# Patient Record
Sex: Female | Born: 1960 | Race: White | Hispanic: No | Marital: Single | Smoking: Former smoker
Health system: Southern US, Community
[De-identification: ages and names within clinical notes are randomized; demographics above are authoritative.]

## PROBLEM LIST (undated history)

## (undated) DIAGNOSIS — I739 Peripheral vascular disease, unspecified: Secondary | ICD-10-CM

## (undated) DIAGNOSIS — E785 Hyperlipidemia, unspecified: Secondary | ICD-10-CM

## (undated) DIAGNOSIS — D649 Anemia, unspecified: Secondary | ICD-10-CM

## (undated) DIAGNOSIS — T8859XA Other complications of anesthesia, initial encounter: Secondary | ICD-10-CM

## (undated) DIAGNOSIS — R519 Headache, unspecified: Secondary | ICD-10-CM

## (undated) DIAGNOSIS — I10 Essential (primary) hypertension: Secondary | ICD-10-CM

## (undated) DIAGNOSIS — A630 Anogenital (venereal) warts: Secondary | ICD-10-CM

## (undated) DIAGNOSIS — T7840XA Allergy, unspecified, initial encounter: Secondary | ICD-10-CM

## (undated) DIAGNOSIS — F419 Anxiety disorder, unspecified: Secondary | ICD-10-CM

## (undated) DIAGNOSIS — F32A Depression, unspecified: Secondary | ICD-10-CM

## (undated) DIAGNOSIS — F329 Major depressive disorder, single episode, unspecified: Secondary | ICD-10-CM

## (undated) HISTORY — PX: OTHER SURGICAL HISTORY: SHX169

## (undated) HISTORY — DX: Major depressive disorder, single episode, unspecified: F32.9

## (undated) HISTORY — DX: Hyperlipidemia, unspecified: E78.5

## (undated) HISTORY — DX: Allergy, unspecified, initial encounter: T78.40XA

## (undated) HISTORY — DX: Anogenital (venereal) warts: A63.0

## (undated) HISTORY — DX: Depression, unspecified: F32.A

---

## 1991-01-08 DIAGNOSIS — A630 Anogenital (venereal) warts: Secondary | ICD-10-CM

## 1991-01-08 HISTORY — DX: Anogenital (venereal) warts: A63.0

## 1995-01-08 HISTORY — PX: PELVIC LAPAROSCOPY: SHX162

## 2000-05-15 ENCOUNTER — Ambulatory Visit (HOSPITAL_COMMUNITY): Admission: RE | Admit: 2000-05-15 | Discharge: 2000-05-15 | Payer: Self-pay | Admitting: Internal Medicine

## 2000-05-15 ENCOUNTER — Encounter: Payer: Self-pay | Admitting: Internal Medicine

## 2000-09-30 ENCOUNTER — Encounter: Payer: Self-pay | Admitting: Family Medicine

## 2000-09-30 ENCOUNTER — Ambulatory Visit (HOSPITAL_COMMUNITY): Admission: RE | Admit: 2000-09-30 | Discharge: 2000-09-30 | Payer: Self-pay | Admitting: Family Medicine

## 2002-02-15 ENCOUNTER — Encounter (HOSPITAL_COMMUNITY): Admission: RE | Admit: 2002-02-15 | Discharge: 2002-03-17 | Payer: Self-pay | Admitting: Oncology

## 2002-02-15 ENCOUNTER — Encounter: Payer: Self-pay | Admitting: Podiatry

## 2002-08-19 ENCOUNTER — Other Ambulatory Visit: Admission: RE | Admit: 2002-08-19 | Discharge: 2002-08-19 | Payer: Self-pay | Admitting: Unknown Physician Specialty

## 2003-03-25 ENCOUNTER — Ambulatory Visit (HOSPITAL_COMMUNITY): Admission: RE | Admit: 2003-03-25 | Discharge: 2003-03-25 | Payer: Self-pay | Admitting: Obstetrics and Gynecology

## 2004-02-07 ENCOUNTER — Ambulatory Visit (HOSPITAL_COMMUNITY): Admission: RE | Admit: 2004-02-07 | Discharge: 2004-02-07 | Payer: Self-pay | Admitting: Family Medicine

## 2004-04-04 ENCOUNTER — Ambulatory Visit (HOSPITAL_COMMUNITY): Admission: RE | Admit: 2004-04-04 | Discharge: 2004-04-04 | Payer: Self-pay | Admitting: Obstetrics and Gynecology

## 2004-06-19 ENCOUNTER — Ambulatory Visit (HOSPITAL_COMMUNITY): Admission: RE | Admit: 2004-06-19 | Discharge: 2004-06-19 | Payer: Self-pay | Admitting: Family Medicine

## 2005-01-01 ENCOUNTER — Ambulatory Visit (HOSPITAL_COMMUNITY): Admission: RE | Admit: 2005-01-01 | Discharge: 2005-01-01 | Payer: Self-pay | Admitting: Family Medicine

## 2005-04-05 ENCOUNTER — Ambulatory Visit (HOSPITAL_COMMUNITY): Admission: RE | Admit: 2005-04-05 | Discharge: 2005-04-05 | Payer: Self-pay | Admitting: Obstetrics and Gynecology

## 2006-04-08 ENCOUNTER — Ambulatory Visit (HOSPITAL_COMMUNITY): Admission: RE | Admit: 2006-04-08 | Discharge: 2006-04-08 | Payer: Self-pay | Admitting: Obstetrics and Gynecology

## 2007-04-28 ENCOUNTER — Ambulatory Visit (HOSPITAL_COMMUNITY): Admission: RE | Admit: 2007-04-28 | Discharge: 2007-04-28 | Payer: Self-pay | Admitting: Obstetrics and Gynecology

## 2008-03-07 ENCOUNTER — Ambulatory Visit (HOSPITAL_COMMUNITY): Admission: RE | Admit: 2008-03-07 | Discharge: 2008-03-07 | Payer: Self-pay | Admitting: Family Medicine

## 2008-06-15 ENCOUNTER — Ambulatory Visit (HOSPITAL_COMMUNITY): Admission: RE | Admit: 2008-06-15 | Discharge: 2008-06-15 | Payer: Self-pay | Admitting: Obstetrics and Gynecology

## 2009-06-02 LAB — HM PAP SMEAR

## 2009-07-04 ENCOUNTER — Ambulatory Visit (HOSPITAL_COMMUNITY): Admission: RE | Admit: 2009-07-04 | Discharge: 2009-07-04 | Payer: Self-pay | Admitting: Obstetrics and Gynecology

## 2009-07-04 LAB — HM MAMMOGRAPHY

## 2009-07-13 ENCOUNTER — Ambulatory Visit (HOSPITAL_COMMUNITY): Admission: RE | Admit: 2009-07-13 | Discharge: 2009-07-13 | Payer: Self-pay | Admitting: Family Medicine

## 2010-02-19 ENCOUNTER — Other Ambulatory Visit (HOSPITAL_COMMUNITY): Payer: Self-pay | Admitting: Family Medicine

## 2010-02-19 DIAGNOSIS — Z139 Encounter for screening, unspecified: Secondary | ICD-10-CM

## 2010-04-06 ENCOUNTER — Encounter: Payer: Self-pay | Admitting: Internal Medicine

## 2010-04-06 DIAGNOSIS — F329 Major depressive disorder, single episode, unspecified: Secondary | ICD-10-CM | POA: Insufficient documentation

## 2010-04-06 DIAGNOSIS — J302 Other seasonal allergic rhinitis: Secondary | ICD-10-CM

## 2010-07-10 ENCOUNTER — Ambulatory Visit (HOSPITAL_COMMUNITY)
Admission: RE | Admit: 2010-07-10 | Discharge: 2010-07-10 | Disposition: A | Discharge status: Home / Self Care | Payer: BC Managed Care – PPO | Source: Ambulatory Visit | Attending: Family Medicine | Admitting: Family Medicine

## 2010-07-10 DIAGNOSIS — Z139 Encounter for screening, unspecified: Secondary | ICD-10-CM

## 2010-07-10 DIAGNOSIS — Z1231 Encounter for screening mammogram for malignant neoplasm of breast: Secondary | ICD-10-CM | POA: Insufficient documentation

## 2011-07-09 ENCOUNTER — Ambulatory Visit: Payer: BC Managed Care – PPO | Admitting: Gynecology

## 2011-07-25 ENCOUNTER — Ambulatory Visit: Payer: BC Managed Care – PPO | Admitting: Gynecology

## 2011-07-31 ENCOUNTER — Ambulatory Visit: Payer: BC Managed Care – PPO | Admitting: Gynecology

## 2011-08-05 ENCOUNTER — Ambulatory Visit (INDEPENDENT_AMBULATORY_CARE_PROVIDER_SITE_OTHER): Payer: Managed Care, Other (non HMO) | Admitting: Gynecology

## 2011-08-05 ENCOUNTER — Encounter: Payer: Self-pay | Admitting: Gynecology

## 2011-08-05 ENCOUNTER — Other Ambulatory Visit: Payer: Self-pay | Admitting: Gynecology

## 2011-08-05 VITALS — BP 120/70 | Ht 63.0 in | Wt 124.0 lb

## 2011-08-05 DIAGNOSIS — Z01419 Encounter for gynecological examination (general) (routine) without abnormal findings: Secondary | ICD-10-CM

## 2011-08-05 NOTE — Patient Instructions (Signed)
Follow up in one year for annual gynecologic exam.  Consider Stop Smoking.  Help is available at [facility]'s smoking cessation program @ [email or web address] or 336-832-0838. OR 1-800-QUIT-NOW (1-800-784-8669) for free smoking cessation counseling.   Smoking Hazards Smoking cigarettes is extremely bad for your health. Tobacco smoke has over 200 known poisons in it. There are over 60 chemicals in tobacco smoke that cause cancer. Some of the chemicals found in cigarette smoke include:  Cyanide.  Benzene.  Formaldehyde.  Methanol (wood alcohol).  Acetylene (fuel used in welding torches).  Ammonia.  Cigarette smoke also contains the poisonous gases nitrogen oxide and carbon monoxide.  Cigarette smokers have an increased risk of many serious medical problems, including: Lung cancer.  Lung disease (such as pneumonia, bronchitis, and emphysema).  Heart attack and chest pain due to the heart not getting enough oxygen (angina).  Heart disease and peripheral blood vessel disease.  Hypertension.  Stroke.  Oral cancer (cancer of the lip, mouth, or voice box).  Bladder cancer.  Pancreatic cancer.  Cervical cancer.  Pregnancy complications, including premature birth.  Low birthweight babies.  Early menopause.  Lower estrogen level for women.  Infertility.  Facial wrinkles.  Blindness.  Increased risk of broken bones (fractures).  Senile dementia.  Stillbirths and smaller newborn babies, birth defects, and genetic damage to sperm.  Stomach ulcers and internal bleeding.  Children of smokers have an increased risk of the following, because of secondhand smoke exposure:  Sudden infant death syndrome (SIDS).  Respiratory infections.  Lung cancer.  Heart disease.  Ear infections.  Smoking causes approximately: 90% of all lung cancer deaths in men.  80% of all lung cancer deaths in women.  90% of deaths from chronic obstructive lung disease.  Compared with nonsmokers, smoking  increases the risk of: Coronary heart disease by 2 to 4 times.  Stroke by 2 to 4 times.  Men developing lung cancer by 23 times.  Women developing lung cancer by 13 times.  Dying from chronic obstructive lung diseases by 12 times.  Someone who smokes 2 packs a day loses about 8 years of his or her life. Even smoking lightly shortens your life expectancy by several years. You can greatly reduce the risk of medical problems for you and your family by stopping now. Smoking is the most preventable cause of death and disease in our society. Within days of quitting smoking, your circulation returns to normal, you decrease the risk of having a heart attack, and your lung capacity improves. There may be some increased phlegm in the first few days after quitting, and it may take months for your lungs to clear up completely. Quitting for 10 years cuts your lung cancer risk to almost that of a nonsmoker. WHY IS SMOKING ADDICTIVE? Nicotine is the chemical agent in tobacco that is capable of causing addiction or dependence.  When you smoke and inhale, nicotine is absorbed rapidly into the bloodstream through your lungs. Nicotine absorbed through the lungs is capable of creating a powerful addiction. Both inhaled and non-inhaled nicotine may be addictive.  Addiction studies of cigarettes and spit tobacco show that addiction to nicotine occurs mainly during the teen years, when young people begin using tobacco products.  WHAT ARE THE BENEFITS OF QUITTING?  There are many health benefits to quitting smoking.  Likelihood of developing cancer and heart disease decreases. Health improvements are seen almost immediately.  Blood pressure, pulse rate, and breathing patterns start returning to normal soon after quitting.  People   who quit may see an improvement in their overall quality of life.  Some people choose to quit all at once. Other options include nicotine replacement products, such as patches, gum, and nasal  sprays. Do not use these products without first checking with your caregiver. QUITTING SMOKING It is not easy to quit smoking. Nicotine is addicting, and longtime habits are hard to change. To start, you can write down all your reasons for quitting, tell your family and friends you want to quit, and ask for their help. Throw your cigarettes away, chew gum or cinnamon sticks, keep your hands busy, and drink extra water or juice. Go for walks and practice deep breathing to relax. Think of all the money you are saving: around $1,000 a year, for the average pack-a-day smoker. Nicotine patches and gum have been shown to improve success at efforts to stop smoking. Zyban (bupropion) is an anti-depressant drug that can be prescribed to reduce nicotine withdrawal symptoms and to suppress the urge to smoke. Smoking is an addiction with both physical and psychological effects. Joining a stop-smoking support group can help you cope with the emotional issues. For more information and advice on programs to stop smoking, call your doctor, your local hospital, or these organizations: American Lung Association - 1-800-LUNGUSA  American Cancer Society - 1-800-ACS-2345  Document Released: 02/01/2004 Document Revised: 09/05/2010 Document Reviewed: 10/05/2008 Exit[employer] Patient Information 2012 [employer].  Smoking Cessation This document explains the best ways for you to quit smoking and new treatments to help. It lists new medicines that can double or triple your chances of quitting and quitting for good. It also considers ways to avoid relapses and concerns you may have about quitting, including weight gain. NICOTINE: A POWERFUL ADDICTION If you have tried to quit smoking, you know how hard it can be. It is hard because nicotine is a very addictive drug. For some people, it can be as addictive as heroin or cocaine. Usually, people make 2 or 3 tries, or more, before finally being able to quit. Each time you try to quit,  you can learn about what helps and what hurts. Quitting takes hard work and a lot of effort, but you can quit smoking. QUITTING SMOKING IS ONE OF THE MOST IMPORTANT THINGS YOU WILL EVER DO.  You will live longer, feel better, and live better.   The impact on your body of quitting smoking is felt almost immediately:   Within 20 minutes, blood pressure decreases. Pulse returns to its normal level.   After 8 hours, carbon monoxide levels in the blood return to normal. Oxygen level increases.   After 24 hours, chance of heart attack starts to decrease. Breath, hair, and body stop smelling like smoke.   After 48 hours, damaged nerve endings begin to recover. Sense of taste and smell improve.   After 72 hours, the body is virtually free of nicotine. Bronchial tubes relax and breathing becomes easier.   After 2 to 12 weeks, lungs can hold more air. Exercise becomes easier and circulation improves.   Quitting will reduce your risk of having a heart attack, stroke, cancer, or lung disease:   After 1 year, the risk of coronary heart disease is cut in half.   After 5 years, the risk of stroke falls to the same as a nonsmoker.   After 10 years, the risk of lung cancer is cut in half and the risk of other cancers decreases significantly.   After 15 years, the risk of coronary   heart disease drops, usually to the level of a nonsmoker.   If you are pregnant, quitting smoking will improve your chances of having a healthy baby.   The people you live with, especially your children, will be healthier.   You will have extra money to spend on things other than cigarettes.  FIVE KEYS TO QUITTING Studies have shown that these 5 steps will help you quit smoking and quit for good. You have the best chances of quitting if you use them together: 1. Get ready.  2. Get support and encouragement.  3. Learn new skills and behaviors.  4. Get medicine to reduce your nicotine addiction and use it correctly.   5. Be prepared for relapse or difficult situations. Be determined to continue trying to quit, even if you do not succeed at first.  1. GET READY  Set a quit date.   Change your environment.   Get rid of ALL cigarettes, ashtrays, matches, and lighters in your home, car, and place of work.   Do not let people smoke in your home.   Review your past attempts to quit. Think about what worked and what did not.   Once you quit, do not smoke. NOT EVEN A PUFF!  2. GET SUPPORT AND ENCOURAGEMENT Studies have shown that you have a better chance of being successful if you have help. You can get support in many ways.  Tell your family, friends, and coworkers that you are going to quit and need their support. Ask them not to smoke around you.   Talk to your caregivers (doctor, dentist, nurse, pharmacist, psychologist, and/or smoking counselor).   Get individual, group, or telephone counseling and support. The more counseling you have, the better your chances are of quitting. Programs are available at local hospitals and health centers. Call your local health department for information about programs in your area.   Spiritual beliefs and practices may help some smokers quit.   Quit meters are small computer programs online or downloadable that keep track of quit statistics, such as amount of "quit-time," cigarettes not smoked, and money saved.   Many smokers find one or more of the many self-help books available useful in helping them quit and stay off tobacco.  3. LEARN NEW SKILLS AND BEHAVIORS  Try to distract yourself from urges to smoke. Talk to someone, go for a walk, or occupy your time with a task.   When you first try to quit, change your routine. Take a different route to work. Drink tea instead of coffee. Eat breakfast in a different place.   Do something to reduce your stress. Take a hot bath, exercise, or read a book.   Plan something enjoyable to do every day. Reward yourself for  not smoking.   Explore interactive web-based programs that specialize in helping you quit.  4. GET MEDICINE AND USE IT CORRECTLY Medicines can help you stop smoking and decrease the urge to smoke. Combining medicine with the above behavioral methods and support can quadruple your chances of successfully quitting smoking. The [place] Food and Drug Administration (FDA) has approved 7 medicines to help you quit smoking. These medicines fall into 3 categories.  Nicotine replacement therapy (delivers nicotine to your body without the negative effects and risks of smoking):   Nicotine gum: Available over-the-counter.   Nicotine lozenges: Available over-the-counter.   Nicotine inhaler: Available by prescription.   Nicotine nasal spray: Available by prescription.   Nicotine skin patches (transdermal): Available by prescription and over-the-counter.     Antidepressant medicine (helps people abstain from smoking, but how this works is unknown):   Bupropion sustained-release (SR) tablets: Available by prescription.   Nicotinic receptor partial agonist (simulates the effect of nicotine in your brain):   Varenicline tartrate tablets: Available by prescription.   Ask your caregiver for advice about which medicines to use and how to use them. Carefully read the information on the package.   Everyone who is trying to quit may benefit from using a medicine. If you are pregnant or trying to become pregnant, nursing an infant, you are under age 18, or you smoke fewer than 10 cigarettes per day, talk to your caregiver before taking any nicotine replacement medicines.   You should stop using a nicotine replacement product and call your caregiver if you experience nausea, dizziness, weakness, vomiting, fast or irregular heartbeat, mouth problems with the lozenge or gum, or redness or swelling of the skin around the patch that does not go away.   Do not use any other product containing nicotine while using a  nicotine replacement product.   Talk to your caregiver before using these products if you have diabetes, heart disease, asthma, stomach ulcers, you had a recent heart attack, you have high blood pressure that is not controlled with medicine, a history of irregular heartbeat, or you have been prescribed medicine to help you quit smoking.  5. BE PREPARED FOR RELAPSE OR DIFFICULT SITUATIONS  Most relapses occur within the first 3 months after quitting. Do not be discouraged if you start smoking again. Remember, most people try several times before they finally quit.   You may have symptoms of withdrawal because your body is used to nicotine. You may crave cigarettes, be irritable, feel very hungry, cough often, get headaches, or have difficulty concentrating.   The withdrawal symptoms are only temporary. They are strongest when you first quit, but they will go away within 10 to 14 days.  Here are some difficult situations to watch for:  Alcohol. Avoid drinking alcohol. Drinking lowers your chances of successfully quitting.   Caffeine. Try to reduce the amount of caffeine you consume. It also lowers your chances of successfully quitting.   Other smokers. Being around smoking can make you want to smoke. Avoid smokers.   Weight gain. Many smokers will gain weight when they quit, usually less than 10 pounds. Eat a healthy diet and stay active. Do not let weight gain distract you from your main goal, quitting smoking. Some medicines that help you quit smoking may also help delay weight gain. You can always lose the weight gained after you quit.   Bad mood or depression. There are a lot of ways to improve your mood other than smoking.  If you are having problems with any of these situations, talk to your caregiver. SPECIAL SITUATIONS AND CONDITIONS Studies suggest that everyone can quit smoking. Your situation or condition can give you a special reason to quit.  Pregnant women/new mothers: By  quitting, you protect your baby's health and your own.   Hospitalized patients: By quitting, you reduce health problems and help healing.   Heart attack patients: By quitting, you reduce your risk of a second heart attack.   Lung, head, and neck cancer patients: By quitting, you reduce your chance of a second cancer.   Parents of children and adolescents: By quitting, you protect your children from illnesses caused by secondhand smoke.  QUESTIONS TO THINK ABOUT Think about the following questions before you try to stop smoking.   You may want to talk about your answers with your caregiver.  Why do you want to quit?   If you tried to quit in the past, what helped and what did not?   What will be the most difficult situations for you after you quit? How will you plan to handle them?   Who can help you through the tough times? Your family? Friends? Caregiver?   What pleasures do you get from smoking? What ways can you still get pleasure if you quit?  Here are some questions to ask your caregiver:  How can you help me to be successful at quitting?   What medicine do you think would be best for me and how should I take it?   What should I do if I need more help?   What is smoking withdrawal like? How can I get information on withdrawal?  Quitting takes hard work and a lot of effort, but you can quit smoking. FOR MORE INFORMATION  Smokefree.gov (http://www.smokefree.gov) provides free, accurate, evidence-based information and professional assistance to help support the immediate and long-term needs of people trying to quit smoking. Document Released: 12/18/2000 Document Revised: 09/05/2010 Document Reviewed: 10/10/2008 Exit[employer] Patient Information 2012 [employer].  

## 2011-08-05 NOTE — Progress Notes (Signed)
Andrea Fleming 11-Jul-1960 [id number]        51 y.o.  G1P0010 new patient for annual exam.  Former patient of Dr. Leota Sauers.  Past medical history,surgical history, medications, allergies, family history and social history were all reviewed and documented in the EPIC chart. ROS:  Was performed and pertinent positives and negatives are included in the history.  Exam: [name] assistant Filed Vitals:   08/05/11 0959  BP: 120/70  Height: 5\' 3"  (1.6 m)  Weight: 124 lb (56.246 kg)   General appearance  Normal Skin grossly normal Head/Neck normal with no cervical or supraclavicular adenopathy thyroid normal Lungs  clear Cardiac RR, without RMG Abdominal  soft, nontender, without masses, organomegaly or hernia Breasts  examined lying and sitting without masses, retractions, discharge or axillary adenopathy. Pelvic  Ext/BUS/vagina  normal   Cervix  normal   Uterus  anteverted, normal size, shape and contour, midline and mobile nontender   Adnexa  Without masses or tenderness    Anus and perineum  normal   Rectovaginal  normal sphincter tone without palpated masses or tenderness.    Assessment/Plan:  51 y.o. G24P0010 female for annual exam, regular menses oral contraceptives..   1. Birth control. Patient's currently on oral contraceptives and she smokes. I reviewed the issues of cigarette smoking, oral contraceptives, over the age of 49 and the risk of stroke heart attack DVT. Reviewed options to include progesterone only such as progesterone only pills, Implanon, IUDs, sterilization. Patient prefers barrier methods with condoms. I reviewed Plan B back up. I gave her literature on the IUD. Patient will stop the birth control pills and use condoms at her choice, follow up if she wants to discuss alternatives. 2. Pap smear. Patient has no history of abnormal Pap smears. Last Pap smear 2012. No Pap smear done today. Discussed current screening guidelines we'll plan less frequent screening every 3-5  years. 3. Mammography. Patient is due now and knows to call to schedule this. Will continue with annual mammography. SBE monthly reviewed. 4. Stop smoking. I reviewed stop smoking strategies with her. 5. Health maintenance. Patient sees Dr. Regino Schultze for routine health maintenance and follow up of her cholesterol. No blood work was done today as it is all done through his office. Follow up in one year, sooner as needed.    Dara Lords MD, 10:56 AM 08/05/2011

## 2011-08-11 LAB — URINALYSIS W MICROSCOPIC + REFLEX CULTURE
Casts: NONE SEEN
Glucose, UA: NEGATIVE mg/dL
pH: 7 (ref 5.0–8.0)

## 2011-08-13 ENCOUNTER — Telehealth: Payer: Self-pay | Admitting: Gynecology

## 2011-08-13 ENCOUNTER — Other Ambulatory Visit (HOSPITAL_COMMUNITY): Payer: Self-pay | Admitting: Family Medicine

## 2011-08-13 DIAGNOSIS — IMO0001 Reserved for inherently not codable concepts without codable children: Secondary | ICD-10-CM

## 2011-08-13 MED ORDER — NITROFURANTOIN MONOHYD MACRO 100 MG PO CAPS: 100.0000 mg | ORAL_CAPSULE | Freq: Two times a day (BID) | ORAL | Status: AC

## 2011-08-13 NOTE — Telephone Encounter (Signed)
Patient informed. 

## 2011-08-13 NOTE — Telephone Encounter (Signed)
Tell patient that her urinalysis showed an infection. Recommend treatment with Macrobid 100 mg twice a day x7 days.

## 2011-08-14 LAB — URINE CULTURE: Colony Count: >=100000

## 2011-08-19 ENCOUNTER — Ambulatory Visit (HOSPITAL_COMMUNITY)
Admission: RE | Admit: 2011-08-19 | Discharge: 2011-08-19 | Disposition: A | Discharge status: Home / Self Care | Payer: Managed Care, Other (non HMO) | Source: Ambulatory Visit | Attending: Family Medicine | Admitting: Family Medicine

## 2011-08-19 DIAGNOSIS — IMO0001 Reserved for inherently not codable concepts without codable children: Secondary | ICD-10-CM

## 2011-08-19 DIAGNOSIS — Z1231 Encounter for screening mammogram for malignant neoplasm of breast: Secondary | ICD-10-CM | POA: Insufficient documentation

## 2013-05-14 ENCOUNTER — Other Ambulatory Visit (HOSPITAL_COMMUNITY): Payer: Self-pay | Admitting: Family Medicine

## 2013-05-14 DIAGNOSIS — Z139 Encounter for screening, unspecified: Secondary | ICD-10-CM

## 2013-05-17 ENCOUNTER — Ambulatory Visit (HOSPITAL_COMMUNITY): Payer: Managed Care, Other (non HMO)

## 2013-05-21 ENCOUNTER — Ambulatory Visit (HOSPITAL_COMMUNITY)
Admission: RE | Admit: 2013-05-21 | Discharge: 2013-05-21 | Disposition: A | Discharge status: Home / Self Care | Payer: BC Managed Care – PPO | Source: Ambulatory Visit | Attending: Family Medicine | Admitting: Family Medicine

## 2013-05-21 DIAGNOSIS — Z1231 Encounter for screening mammogram for malignant neoplasm of breast: Secondary | ICD-10-CM | POA: Insufficient documentation

## 2013-05-21 DIAGNOSIS — Z139 Encounter for screening, unspecified: Secondary | ICD-10-CM

## 2013-11-08 ENCOUNTER — Encounter: Payer: Self-pay | Admitting: Gynecology

## 2014-07-14 ENCOUNTER — Other Ambulatory Visit (HOSPITAL_COMMUNITY): Payer: Self-pay | Admitting: Family Medicine

## 2014-07-14 DIAGNOSIS — Z1231 Encounter for screening mammogram for malignant neoplasm of breast: Secondary | ICD-10-CM

## 2014-07-25 ENCOUNTER — Ambulatory Visit (HOSPITAL_COMMUNITY): Payer: Managed Care, Other (non HMO)

## 2014-08-05 ENCOUNTER — Ambulatory Visit (HOSPITAL_COMMUNITY)
Admission: RE | Admit: 2014-08-05 | Discharge: 2014-08-05 | Disposition: A | Discharge status: Home / Self Care | Payer: BLUE CROSS/BLUE SHIELD | Source: Ambulatory Visit | Attending: Family Medicine | Admitting: Family Medicine

## 2014-08-05 DIAGNOSIS — Z1231 Encounter for screening mammogram for malignant neoplasm of breast: Secondary | ICD-10-CM | POA: Insufficient documentation

## 2014-10-03 ENCOUNTER — Encounter (INDEPENDENT_AMBULATORY_CARE_PROVIDER_SITE_OTHER): Payer: Self-pay | Admitting: *Deleted

## 2015-07-28 ENCOUNTER — Other Ambulatory Visit (HOSPITAL_COMMUNITY): Payer: Self-pay | Admitting: Registered Nurse

## 2015-07-28 DIAGNOSIS — Z1231 Encounter for screening mammogram for malignant neoplasm of breast: Secondary | ICD-10-CM

## 2015-10-30 ENCOUNTER — Ambulatory Visit (HOSPITAL_COMMUNITY): Payer: BLUE CROSS/BLUE SHIELD

## 2015-11-17 ENCOUNTER — Observation Stay (HOSPITAL_COMMUNITY)
Admission: EM | Admit: 2015-11-17 | Discharge: 2015-11-18 | Disposition: A | Discharge status: Home / Self Care | Payer: 59 | Attending: Family Medicine | Admitting: Family Medicine

## 2015-11-17 ENCOUNTER — Encounter (HOSPITAL_COMMUNITY): Payer: Self-pay | Admitting: *Deleted

## 2015-11-17 ENCOUNTER — Emergency Department (HOSPITAL_COMMUNITY): Payer: 59

## 2015-11-17 DIAGNOSIS — Z791 Long term (current) use of non-steroidal anti-inflammatories (NSAID): Secondary | ICD-10-CM | POA: Insufficient documentation

## 2015-11-17 DIAGNOSIS — Z79899 Other long term (current) drug therapy: Secondary | ICD-10-CM | POA: Diagnosis not present

## 2015-11-17 DIAGNOSIS — H6011 Cellulitis of right external ear: Principal | ICD-10-CM | POA: Insufficient documentation

## 2015-11-17 DIAGNOSIS — L02811 Cutaneous abscess of head [any part, except face]: Secondary | ICD-10-CM | POA: Diagnosis not present

## 2015-11-17 DIAGNOSIS — H9201 Otalgia, right ear: Secondary | ICD-10-CM | POA: Diagnosis present

## 2015-11-17 DIAGNOSIS — L03811 Cellulitis of head [any part, except face]: Secondary | ICD-10-CM

## 2015-11-17 DIAGNOSIS — F17210 Nicotine dependence, cigarettes, uncomplicated: Secondary | ICD-10-CM | POA: Diagnosis not present

## 2015-11-17 DIAGNOSIS — L03818 Cellulitis of other sites: Secondary | ICD-10-CM

## 2015-11-17 LAB — CBC WITH DIFFERENTIAL/PLATELET
BASOS PCT: 0 %
Basophils Absolute: 0 10*3/uL (ref 0.0–0.1)
Eosinophils Absolute: 0.1 10*3/uL (ref 0.0–0.7)
Eosinophils Relative: 1 %
HEMATOCRIT: 42.4 % (ref 36.0–46.0)
Hemoglobin: 14.5 g/dL (ref 12.0–15.0)
Lymphocytes Relative: 41 %
Lymphs Abs: 4.9 10*3/uL — ABNORMAL HIGH (ref 0.7–4.0)
MCH: 33 pg (ref 26.0–34.0)
MCHC: 34.2 g/dL (ref 30.0–36.0)
MCV: 96.4 fL (ref 78.0–100.0)
MONO ABS: 0.7 10*3/uL (ref 0.1–1.0)
MONOS PCT: 6 %
NEUTROS ABS: 6.2 10*3/uL (ref 1.7–7.7)
Neutrophils Relative %: 52 %
Platelets: 324 10*3/uL (ref 150–400)
RBC: 4.4 MIL/uL (ref 3.87–5.11)
RDW: 13.3 % (ref 11.5–15.5)
WBC: 11.9 10*3/uL — ABNORMAL HIGH (ref 4.0–10.5)

## 2015-11-17 LAB — BASIC METABOLIC PANEL
Anion gap: 7 (ref 5–15)
BUN: 8 mg/dL (ref 6–20)
CALCIUM: 9.5 mg/dL (ref 8.9–10.3)
CO2: 26 mmol/L (ref 22–32)
CREATININE: 0.56 mg/dL (ref 0.44–1.00)
Chloride: 105 mmol/L (ref 101–111)
GFR calc Af Amer: >60 mL/min (ref >60)
GFR calc non Af Amer: >60 mL/min (ref >60)
GLUCOSE: 78 mg/dL (ref 65–99)
Potassium: 3.7 mmol/L (ref 3.5–5.1)
Sodium: 138 mmol/L (ref 135–145)

## 2015-11-17 LAB — CG4 I-STAT (LACTIC ACID): LACTIC ACID, VENOUS: 0.71 mmol/L (ref 0.5–1.9)

## 2015-11-17 MED ORDER — SODIUM CHLORIDE 0.9 % IV SOLN: 250.0000 mL | INTRAVENOUS | Status: DC | PRN

## 2015-11-17 MED ORDER — VANCOMYCIN HCL IN DEXTROSE 1-5 GM/200ML-% IV SOLN
INTRAVENOUS | Status: AC
  Filled 2015-11-17: qty 200

## 2015-11-17 MED ORDER — SODIUM CHLORIDE 0.9% FLUSH: 3.0000 mL | INTRAVENOUS | Status: DC | PRN

## 2015-11-17 MED ORDER — PIPERACILLIN-TAZOBACTAM 3.375 G IVPB 30 MIN
3.3750 g | Freq: Once | INTRAVENOUS | Status: AC
  Administered 2015-11-17: 3.375 g via INTRAVENOUS
  Filled 2015-11-17: qty 50

## 2015-11-17 MED ORDER — VANCOMYCIN HCL 10 G IV SOLR
1500.0000 mg | INTRAVENOUS | Status: DC
  Filled 2015-11-17 (×3): qty 1500

## 2015-11-17 MED ORDER — POVIDONE-IODINE 10 % EX SOLN
CUTANEOUS | Status: AC
  Filled 2015-11-17: qty 118

## 2015-11-17 MED ORDER — VANCOMYCIN HCL IN DEXTROSE 1-5 GM/200ML-% IV SOLN
1000.0000 mg | Freq: Once | INTRAVENOUS | Status: AC
  Administered 2015-11-17: 1000 mg via INTRAVENOUS
  Filled 2015-11-17: qty 200

## 2015-11-17 MED ORDER — SODIUM CHLORIDE 0.9 % IV BOLUS (SEPSIS)
1000.0000 mL | Freq: Once | INTRAVENOUS | Status: AC
  Administered 2015-11-17: 1000 mL via INTRAVENOUS

## 2015-11-17 MED ORDER — DEXTROSE 5 % IV SOLN
1.0000 g | Freq: Three times a day (TID) | INTRAVENOUS | Status: DC
  Administered 2015-11-17 – 2015-11-18 (×2): 1 g via INTRAVENOUS
  Filled 2015-11-17 (×9): qty 1

## 2015-11-17 MED ORDER — VANCOMYCIN HCL IN DEXTROSE 1-5 GM/200ML-% IV SOLN: 1000.0000 mg | Freq: Once | INTRAVENOUS | Status: DC

## 2015-11-17 MED ORDER — IBUPROFEN 800 MG PO TABS: 800.0000 mg | ORAL_TABLET | Freq: Three times a day (TID) | ORAL | Status: DC | PRN

## 2015-11-17 MED ORDER — CITALOPRAM HYDROBROMIDE 20 MG PO TABS
10.0000 mg | ORAL_TABLET | Freq: Every day | ORAL | Status: DC
  Administered 2015-11-17: 10 mg via ORAL
  Filled 2015-11-17 (×2): qty 1

## 2015-11-17 MED ORDER — OXYCODONE HCL 5 MG PO TABS: 5.0000 mg | ORAL_TABLET | ORAL | Status: DC | PRN

## 2015-11-17 MED ORDER — ALPRAZOLAM 0.5 MG PO TABS: 0.5000 mg | ORAL_TABLET | Freq: Every evening | ORAL | Status: DC | PRN

## 2015-11-17 MED ORDER — SODIUM CHLORIDE 0.9% FLUSH
3.0000 mL | Freq: Two times a day (BID) | INTRAVENOUS | Status: DC
  Administered 2015-11-17 – 2015-11-18 (×2): 3 mL via INTRAVENOUS

## 2015-11-17 MED ORDER — LIDOCAINE HCL (PF) 1 % IJ SOLN
INTRAMUSCULAR | Status: AC
  Filled 2015-11-17: qty 10

## 2015-11-17 MED ORDER — IOPAMIDOL (ISOVUE-300) INJECTION 61%
75.0000 mL | Freq: Once | INTRAVENOUS | Status: AC | PRN
  Administered 2015-11-17: 75 mL via INTRAVENOUS

## 2015-11-17 NOTE — ED Notes (Signed)
ED Provider at bedside. 

## 2015-11-17 NOTE — Progress Notes (Addendum)
Pharmacy Antibiotic Note  Andrea HughsKathy G Fleming is a 55 y.o. female admitted on 11/17/2015 with cellulitis.  Pharmacy has been consulted for vancomycin and cefepime dosing. She has failed outpt antibiotics doxycycline.  She received vancomycin 1 gm and zosyn 3.375 gm in the ED.  Plan: Vancomycin 1500 IV every 24 hours.  Goal trough 10-15 mcg/mL.  Cefepime 1 gm IV q8 hours F/u renal function, cultures and clinical course  Height: 5\' 4"  (162.6 cm) Weight: 128 lb (58.1 kg) IBW/kg (Calculated) : 54.7  Temp (24hrs), Avg:97.8 F (36.6 C), Min:97.8 F (36.6 C), Max:97.8 F (36.6 C)   Recent Labs Lab 11/17/15 1114  WBC 11.9*  CREATININE 0.56    Estimated Creatinine Clearance: 71 mL/min (by C-G formula based on SCr of 0.56 mg/dL).    Allergies  Allergen Reactions  . Sulfa Antibiotics     GI upset     Antimicrobials this admission: vanc 11/10 >>  cefepime 11/10 >>  Zosyn 11/10 x 1 dose  Thank you for allowing pharmacy to be a part of this patient's care.  Talbert CageSeay, Itzel Mckibbin Poteet 11/17/2015 4:00 PM   Addum:  Will place on oral clindamycin 450 mg po tid.  Thank you, Talbert CageLora Corda Shutt, PharmD

## 2015-11-17 NOTE — ED Triage Notes (Signed)
Pt comes in with right ear pain. Pt states this pain started on Tuesday. She went to her PCP yesterday and was placed on doxycycline. Pt denies any drainage. Pts ear is swollen, red, and warm.

## 2015-11-17 NOTE — H&P (Signed)
History and Physical    Andrea Fleming:[id number] DOB: [birth year] DOA: 11/17/2015  PCP: Cassell Smiles., MD  Patient coming from: home  Chief Complaint: right ear infection  HPI: Andrea Fleming is a 55 y.o. female with medical history significant of allergies and hpl, presenting to the hospital after patient failed outpatient antibiotic therapy for skin infection near her ear. She reports that within the last 3 days this occurred. Has gotten progressively worse despite being on oral antibiotics. Subsequently her primary care physician sent her to the hospital for further evaluation recommendations. Patient denies any other associated symptoms or problems. Essentially has pain near right earlobe. The problem has gotten worse and now involves her right earlobe. She reports this started as a pimple which she tried to pop.  ED Course: Patient had CT scan which reported a 1 cm dermal abscess below the right ear. We were subsequently consulted for further medical evaluation and recommendations. ENT was also consulted  Review of Systems: As per HPI otherwise 10 point review of systems negative.    Past Medical History:  Diagnosis Date  . Allergy   . Condyloma 1993  . Depression   . Hyperlipidemia     Past Surgical History:  Procedure Laterality Date  . Ear drum surg    . PELVIC LAPAROSCOPY  1997   rt ectopic     reports that she has been smoking Cigarettes.  She has been smoking about 0.25 packs per day. She has never used smokeless tobacco. She reports that she drinks alcohol. She reports that she does not use drugs.  Allergies  Allergen Reactions  . Sulfa Antibiotics     GI upset     Family History  Problem Relation Age of Onset  . Stroke Mother   . Hypertension Mother     Prior to Admission medications   Medication Sig Start Date End Date Taking? Authorizing Provider  ALPRAZolam Prudy Feeler) 0.5 MG tablet Take 0.5 mg by mouth at bedtime as needed.     Yes Historical  Provider, MD  cetirizine (ZYRTEC) 10 MG tablet Take 10 mg by mouth daily.     Yes Historical Provider, MD  citalopram (CELEXA) 10 MG tablet Take 10 mg by mouth daily.    Yes Historical Provider, MD  doxycycline (VIBRAMYCIN) 100 MG capsule 1 capsule 2 (two) times daily. 11/16/15  Yes Historical Provider, MD  ibuprofen (ADVIL,MOTRIN) 800 MG tablet Take 800 mg by mouth every 8 (eight) hours as needed.     Yes Historical Provider, MD  azithromycin (ZITHROMAX) 250 MG tablet 1 tablet as directed. 11/14/15   Historical Provider, MD  clobetasol cream (TEMOVATE) 0.05 % 1 application 3 (three) times daily. 11/15/15   Historical Provider, MD    Physical Exam: Vitals:   11/17/15 1025 11/17/15 1221 11/17/15 1300 11/17/15 1330  BP: 145/93 118/89 104/84 114/88  Pulse: 108 76 74 95  Resp: 18 16  16   Temp: 97.8 F (36.6 C)     TempSrc: Oral     SpO2: 99% 100% 99% 91%  Weight: 58.1 kg (128 lb)     Height: 5\' 4"  (1.626 m)         Constitutional: NAD, calm, comfortable Vitals:   11/17/15 1025 11/17/15 1221 11/17/15 1300 11/17/15 1330  BP: 145/93 118/89 104/84 114/88  Pulse: 108 76 74 95  Resp: 18 16  16   Temp: 97.8 F (36.6 C)     TempSrc: Oral     SpO2: 99% 100% 99% 91%  Weight: 58.1 kg (128 lb)     Height: 5\' 4"  (1.626 m)      Eyes: PERRL, lids and conjunctivae normal ENMT: Mucous membranes are moist. Posterior pharynx clear of any exudate or lesions.Normal dentition.  At right ear lobe it is beefy red with surrounding cellulitis Neck: normal, supple, no masses, no thyromegaly Respiratory: clear to auscultation bilaterally, no wheezing, no crackles. Normal respiratory effort. No accessory muscle use.  Cardiovascular: Regular rate and rhythm, no murmurs / rubs / gallops. No extremity edema. 2+ pedal pulses. No carotid bruits.  Abdomen: no tenderness, no masses palpated. No hepatosplenomegaly. Bowel sounds positive.  Musculoskeletal: no clubbing / cyanosis. No joint deformity upper and lower  extremities. Good ROM, no contractures. Normal muscle tone.  Skin: no rashes, lesions, ulcers. No induration apart from part listed above at ear Neurologic: CN 3-12 grossly intact. Sensation intact, Strength 5/5 in all 4.  Psychiatric: Normal judgment and insight. Alert and oriented x 3. Normal mood.    Labs on Admission: I have personally reviewed following labs and imaging studies  CBC:  Recent Labs Lab 11/17/15 1114  WBC 11.9*  NEUTROABS 6.2  HGB 14.5  HCT 42.4  MCV 96.4  PLT 324   Basic Metabolic Panel:  Recent Labs Lab 11/17/15 1114  NA 138  K 3.7  CL 105  CO2 26  GLUCOSE 78  BUN 8  CREATININE 0.56  CALCIUM 9.5   GFR: Estimated Creatinine Clearance: 71 mL/min (by C-G formula based on SCr of 0.56 mg/dL). Liver Function Tests: No results for input(s): AST, ALT, ALKPHOS, BILITOT, PROT, ALBUMIN in the last 168 hours. No results for input(s): LIPASE, AMYLASE in the last 168 hours. No results for input(s): AMMONIA in the last 168 hours. Coagulation Profile: No results for input(s): INR, PROTIME in the last 168 hours. Cardiac Enzymes: No results for input(s): CKTOTAL, CKMB, CKMBINDEX, TROPONINI in the last 168 hours. BNP (last 3 results) No results for input(s): PROBNP in the last 8760 hours. HbA1C: No results for input(s): HGBA1C in the last 72 hours. CBG: No results for input(s): GLUCAP in the last 168 hours. Lipid Profile: No results for input(s): CHOL, HDL, LDLCALC, TRIG, CHOLHDL, LDLDIRECT in the last 72 hours. Thyroid Function Tests: No results for input(s): TSH, T4TOTAL, FREET4, T3FREE, THYROIDAB in the last 72 hours. Anemia Panel: No results for input(s): VITAMINB12, FOLATE, FERRITIN, TIBC, IRON, RETICCTPCT in the last 72 hours. Urine analysis:    Component Value Date/Time   COLORURINE YELLOW 08/05/2011 1104   APPEARANCEUR CLEAR 08/05/2011 1104   LABSPEC 1.028 08/05/2011 1104   PHURINE 7.0 08/05/2011 1104   GLUCOSEU NEG 08/05/2011 1104   HGBUR NEG  08/05/2011 1104   BILIRUBINUR SMALL (A) 08/05/2011 1104   KETONESUR TRACE (A) 08/05/2011 1104   PROTEINUR NEG 08/05/2011 1104   UROBILINOGEN 0.2 08/05/2011 1104   NITRITE NEG 08/05/2011 1104   LEUKOCYTESUR TRACE (A) 08/05/2011 1104   Sepsis Labs: !!!!!!!!!!!!!!!!!!!!!!!!!!!!!!!!!!!!!!!!!!!! @LABRCNTIP (procalcitonin:4,lacticidven:4) )No results found for this or any previous visit (from the past 240 hour(s)).   Radiological Exams on Admission: Ct Soft Tissue Neck W Contrast  Result Date: 11/17/2015 CLINICAL DATA:  Abscess behind right ear EXAM: CT NECK WITH CONTRAST TECHNIQUE: Multidetector CT imaging of the neck was performed using the standard protocol following the bolus administration of intravenous contrast. CONTRAST:  75mL ISOVUE-300 IOPAMIDOL (ISOVUE-300) INJECTION 61% COMPARISON:  None. FINDINGS: Pharynx and larynx: The nasopharynx and oropharynx are normal. Tonsils normal. Epiglottis and vocal cords normal. Salivary glands: No inflammation, mass,  or stone. Thyroid: Negative Lymph nodes: Enhancing 8 mm right level 2 lymph node likely reactive due to soft tissue abscess below the right ear. Vascular: Negative Limited intracranial: Negative Visualized orbits: Negative Mastoids and visualized paranasal sinuses: Negative Skeleton: Mild degenerative change in the cervical spine. Thoracic levoscoliosis. No acute skeletal abnormality. Upper chest: Apical emphysema.  Lung apices clear Other: Soft tissue swelling and edema below the right ear. 1 cm nonenhancing fluid collection compatible with dermal abscess below the right ear. There is surrounding cellulitis. IMPRESSION: 1 cm dermal abscess below the right ear. External auditory canal appears normal. Right parotid normal. Reactive right level 2 lymph node due to infection Electronically Signed   By: Marlan Palauharles  Clark M.D.   On: 11/17/2015 12:29    Assessment/Plan Principal Problem:   Cellulitis and abscess of head -  Place on IV antibiotics - ER  to attempt I and D - reassess next am.  - reassess cbc  - pt has allergy to sulfa as such will have to consider d/c on clindamycin once patient ready for d/c  Allergies - stable will hold outpatient medication for now  Depression - stable continue home medication regimen   DVT prophylaxis: scd's Code Status: full Family Communication: d/c patient directly Disposition Plan: pending improvement in condition. Will reassess next am. Consults called: ENT specialist by ER personnel Admission status: obs   Penny PiaVEGA, Vaidehi Braddy MD [facility] Pager [phone]  If 7PM-7AM, please contact night-coverage [email or web address] Password [email or web address]  11/17/2015, 3:44 PM

## 2015-11-18 DIAGNOSIS — L02811 Cutaneous abscess of head [any part, except face]: Secondary | ICD-10-CM | POA: Diagnosis not present

## 2015-11-18 DIAGNOSIS — L03811 Cellulitis of head [any part, except face]: Secondary | ICD-10-CM | POA: Diagnosis not present

## 2015-11-18 LAB — CBC
HEMATOCRIT: 39.4 % (ref 36.0–46.0)
Hemoglobin: 13.4 g/dL (ref 12.0–15.0)
MCH: 32.8 pg (ref 26.0–34.0)
MCHC: 34 g/dL (ref 30.0–36.0)
MCV: 96.6 fL (ref 78.0–100.0)
PLATELETS: 305 10*3/uL (ref 150–400)
RBC: 4.08 MIL/uL (ref 3.87–5.11)
RDW: 13.5 % (ref 11.5–15.5)
WBC: 9.8 10*3/uL (ref 4.0–10.5)

## 2015-11-18 LAB — BASIC METABOLIC PANEL
Anion gap: 6 (ref 5–15)
BUN: 8 mg/dL (ref 6–20)
CHLORIDE: 109 mmol/L (ref 101–111)
CO2: 22 mmol/L (ref 22–32)
CREATININE: 0.6 mg/dL (ref 0.44–1.00)
Calcium: 8.8 mg/dL — ABNORMAL LOW (ref 8.9–10.3)
GFR calc non Af Amer: >60 mL/min (ref >60)
Glucose, Bld: 107 mg/dL — ABNORMAL HIGH (ref 65–99)
POTASSIUM: 3.8 mmol/L (ref 3.5–5.1)
Sodium: 137 mmol/L (ref 135–145)

## 2015-11-18 MED ORDER — CLINDAMYCIN HCL 150 MG PO CAPS: 450.0000 mg | ORAL_CAPSULE | Freq: Three times a day (TID) | ORAL | 0 refills | Status: AC

## 2015-11-18 MED ORDER — CLINDAMYCIN HCL 150 MG PO CAPS
450.0000 mg | ORAL_CAPSULE | Freq: Three times a day (TID) | ORAL | Status: DC
  Administered 2015-11-18 (×2): 450 mg via ORAL
  Filled 2015-11-18 (×2): qty 3

## 2015-11-18 MED ORDER — SODIUM CHLORIDE 0.9 % IV BOLUS (SEPSIS)
250.0000 mL | Freq: Once | INTRAVENOUS | Status: AC
  Administered 2015-11-18: 250 mL via INTRAVENOUS

## 2015-11-18 NOTE — Discharge Summary (Signed)
Physician Discharge Summary  Marius DitchKathy G Sanzone ZOX:[id number] DOB: 04-16-1960 DOA: 11/17/2015  PCP: Cassell SmilesFUSCO,LAWRENCE J., MD  Admit date: 11/17/2015 Discharge date: 11/18/2015  Time spent: > 35 minutes  Recommendations for Outpatient Follow-up:  1. Please f/u with wound culture results 2. Will d/c on 6 more days of antibiotics to equal a 7 day treatment regimen   Discharge Diagnoses:  Principal Problem:   Cellulitis and abscess of head   Discharge Condition: stable  Diet recommendation: regular diet  Filed Weights   11/17/15 1025  Weight: 58.1 kg (128 lb)    History of present illness:  55 y.o. female with medical history significant of allergies and hpl, presenting to the hospital after patient failed outpatient antibiotic therapy for skin infection near her ear.  Hospital Course:  Cellulitis and asbcess at right ear lobe - improved after IV antibiotics. Tolerated oral clindamycin. And patient requesting d/c. Her WBC is wnl and patient afebrile.  - 6 more days of clindamycin  Procedures:  None  Consultations:  None  Discharge Exam: Vitals:   11/17/15 2142 11/18/15 0519  BP: 94/66 (!) 81/58  Pulse: 76 82  Resp: 16 16  Temp: 97.8 F (36.6 C) 98.2 F (36.8 C)    General: Pt in nad, alert and awake Cardiovascular: rrr, no rubs Respiratory: no increased wob, no wheezes  Discharge Instructions   Discharge Instructions    Call MD for:  severe uncontrolled pain    Complete by:  As directed    Call MD for:  temperature >100.4    Complete by:  As directed    Diet - low sodium heart healthy    Complete by:  As directed    Discharge instructions    Complete by:  As directed    Please f/u with pcp to f/u on wound cultures.   Increase activity slowly    Complete by:  As directed      Current Discharge Medication List    START taking these medications   Details  clindamycin (CLEOCIN) 150 MG capsule Take 3 capsules (450 mg total) by mouth every 8 (eight)  hours. Qty: 54 capsule, Refills: 0      CONTINUE these medications which have NOT CHANGED   Details  ALPRAZolam (XANAX) 0.5 MG tablet Take 0.5 mg by mouth at bedtime as needed.      cetirizine (ZYRTEC) 10 MG tablet Take 10 mg by mouth daily.      citalopram (CELEXA) 10 MG tablet Take 10 mg by mouth daily.     ibuprofen (ADVIL,MOTRIN) 800 MG tablet Take 800 mg by mouth every 8 (eight) hours as needed.      clobetasol cream (TEMOVATE) 0.05 % 1 application 3 (three) times daily. Refills: 0      STOP taking these medications     doxycycline (VIBRAMYCIN) 100 MG capsule      azithromycin (ZITHROMAX) 250 MG tablet        Allergies  Allergen Reactions  . Sulfa Antibiotics     GI upset       The results of significant diagnostics from this hospitalization (including imaging, microbiology, ancillary and laboratory) are listed below for reference.    Significant Diagnostic Studies: Ct Soft Tissue Neck W Contrast  Result Date: 11/17/2015 CLINICAL DATA:  Abscess behind right ear EXAM: CT NECK WITH CONTRAST TECHNIQUE: Multidetector CT imaging of the neck was performed using the standard protocol following the bolus administration of intravenous contrast. CONTRAST:  75mL ISOVUE-300 IOPAMIDOL (ISOVUE-300) INJECTION 61% COMPARISON:  None. FINDINGS: Pharynx and larynx: The nasopharynx and oropharynx are normal. Tonsils normal. Epiglottis and vocal cords normal. Salivary glands: No inflammation, mass, or stone. Thyroid: Negative Lymph nodes: Enhancing 8 mm right level 2 lymph node likely reactive due to soft tissue abscess below the right ear. Vascular: Negative Limited intracranial: Negative Visualized orbits: Negative Mastoids and visualized paranasal sinuses: Negative Skeleton: Mild degenerative change in the cervical spine. Thoracic levoscoliosis. No acute skeletal abnormality. Upper chest: Apical emphysema.  Lung apices clear Other: Soft tissue swelling and edema below the right ear. 1 cm  nonenhancing fluid collection compatible with dermal abscess below the right ear. There is surrounding cellulitis. IMPRESSION: 1 cm dermal abscess below the right ear. External auditory canal appears normal. Right parotid normal. Reactive right level 2 lymph node due to infection Electronically Signed   By: Marlan Palauharles  Clark M.D.   On: 11/17/2015 12:29    Microbiology: Recent Results (from the past 240 hour(s))  Wound or Superficial Culture     Status: None (Preliminary result)   Collection Time: 11/17/15  3:40 PM  Result Value Ref Range Status   Specimen Description EAR right  Final   Special Requests Normal  Final   Gram Stain   Final    FEW WBC PRESENT, PREDOMINANTLY PMN RARE GRAM POSITIVE COCCI IN PAIRS FEW GRAM POSITIVE RODS    Culture   Final    NO GROWTH < 12 HOURS [facility]    Report Status PENDING  Incomplete  Culture, blood (routine x 2)     Status: None (Preliminary result)   Collection Time: 11/17/15  5:00 PM  Result Value Ref Range Status   Specimen Description BLOOD LEFT ARM  Final   Special Requests BOTTLES DRAWN AEROBIC AND ANAEROBIC 6CC EACH  Final   Culture NO GROWTH < 24 HOURS  Final   Report Status PENDING  Incomplete  Culture, blood (routine x 2)     Status: None (Preliminary result)   Collection Time: 11/17/15  5:10 PM  Result Value Ref Range Status   Specimen Description BLOOD LEFT ARM  Final   Special Requests BOTTLES DRAWN AEROBIC AND ANAEROBIC 6CC EACH  Final   Culture NO GROWTH < 24 HOURS  Final   Report Status PENDING  Incomplete     Labs: Basic Metabolic Panel:  Recent Labs Lab 11/17/15 1114 11/18/15 0629  NA 138 137  K 3.7 3.8  CL 105 109  CO2 26 22  GLUCOSE 78 107*  BUN 8 8  CREATININE 0.56 0.60  CALCIUM 9.5 8.8*   Liver Function Tests: No results for input(s): AST, ALT, ALKPHOS, BILITOT, PROT, ALBUMIN in the last 168 hours. No results for input(s): LIPASE, AMYLASE in the last 168 hours. No results for input(s):  AMMONIA in the last 168 hours. CBC:  Recent Labs Lab 11/17/15 1114 11/18/15 0629  WBC 11.9* 9.8  NEUTROABS 6.2  --   HGB 14.5 13.4  HCT 42.4 39.4  MCV 96.4 96.6  PLT 324 305   Cardiac Enzymes: No results for input(s): CKTOTAL, CKMB, CKMBINDEX, TROPONINI in the last 168 hours. BNP: BNP (last 3 results) No results for input(s): BNP in the last 8760 hours.  ProBNP (last 3 results) No results for input(s): PROBNP in the last 8760 hours.  CBG: No results for input(s): GLUCAP in the last 168 hours.   Signed:  Penny PiaVEGA, Hajar Penninger MD.  [facility] 11/18/2015, 1:46 PM

## 2015-11-20 NOTE — ED Provider Notes (Signed)
[facility]-EMERGENCY DEPT Provider Note   CSN: [id number] Arrival date & time: 11/17/15  1018     History   Chief Complaint Chief Complaint  Patient presents with  . Otalgia    HPI Andrea Fleming is a 55 y.o. female.  55 yo female with no sig pmh presents to the ED today with right ear pain and referral from pcp. Patient states that she had a "pimple" behind her right ear that develop approx 1 week ago and tried to pop at home. She was seen by PCP who tried to drain abscess but was unable to express any purulent fluid. She was at that time put onto z pack for sinus infection. She was seen back at her pcp 3 days ago for increased swelling and pain of her right outer ear lob. She was at that time started on doxy by pcp. Patient returned to the PCP today with increasing pain, edema, and erythema to the right outer ear. PCP was concerned for cellulitis and need of IV abx. Patient has not tried anything for the pain pta. She denies any drainage of abscess. She denies any fever, chills, nausea, vomiting, drainage of ear, rhinorrhea, sore throat, or any other associated symptoms.          Past Medical History:  Diagnosis Date  . Allergy   . Condyloma 1993  . Depression   . Hyperlipidemia     Patient Active Problem List   Diagnosis Date Noted  . Cellulitis and abscess of head 11/17/2015  . Depression 04/06/2010  . Seasonal allergies 04/06/2010    Past Surgical History:  Procedure Laterality Date  . Ear drum surg    . PELVIC LAPAROSCOPY  1997   rt ectopic    OB History    Gravida Para Term Preterm AB Living   1       1 0   SAB TAB Ectopic Multiple Live Births       1           Home Medications    Prior to Admission medications   Medication Sig Start Date End Date Taking? Authorizing Provider  ALPRAZolam Prudy Feeler(XANAX) 0.5 MG tablet Take 0.5 mg by mouth at bedtime as needed.     Yes Historical Provider, MD  cetirizine (ZYRTEC) 10 MG tablet Take 10 mg by mouth daily.     Yes  Historical Provider, MD  citalopram (CELEXA) 10 MG tablet Take 10 mg by mouth daily.    Yes Historical Provider, MD  ibuprofen (ADVIL,MOTRIN) 800 MG tablet Take 800 mg by mouth every 8 (eight) hours as needed.     Yes Historical Provider, MD  clindamycin (CLEOCIN) 150 MG capsule Take 3 capsules (450 mg total) by mouth every 8 (eight) hours. 11/18/15 11/24/15  Penny Piarlando Vega, MD  clobetasol cream (TEMOVATE) 0.05 % 1 application 3 (three) times daily. 11/15/15   Historical Provider, MD    Family History Family History  Problem Relation Age of Onset  . Stroke Mother   . Hypertension Mother     Social History Social History  Substance Use Topics  . Smoking status: Current Every Day Smoker    Packs/day: 0.25    Types: Cigarettes  . Smokeless tobacco: Never Used  . Alcohol use Yes     Comment: occ. wine     Allergies   Sulfa antibiotics   Review of Systems Review of Systems  Constitutional: Negative for chills and fever.  HENT: Positive for ear pain. Negative for sore  throat.   Eyes: Negative for pain and visual disturbance.  Respiratory: Negative for cough and shortness of breath.   Cardiovascular: Negative for chest pain and palpitations.  Gastrointestinal: Negative for abdominal pain and vomiting.  Genitourinary: Negative for dysuria and hematuria.  Musculoskeletal: Negative for arthralgias and back pain.  Skin: Negative for color change and rash.  Neurological: Negative for dizziness, syncope, weakness, light-headedness and headaches.  All other systems reviewed and are negative.    Physical Exam Updated Vital Signs BP 91/66 (BP Location: Left Arm)   Pulse 79   Temp 98.3 F (36.8 C) (Oral)   Resp 20   Ht 5\' 4"  (1.626 m)   Wt 58.1 kg   LMP 08/17/2011   SpO2 98%   BMI 21.97 kg/m   Physical Exam  Constitutional: She appears well-developed and well-nourished. No distress.  HENT:  Head: Normocephalic and atraumatic.  Right Ear: Hearing, tympanic membrane and ear  canal normal.  Left Ear: Tympanic membrane, external ear and ear canal normal.  Mouth/Throat: Oropharynx is clear and moist.  1cm area of induration and fluctuance behind the right earlobe with edema, erythema, to the ear lob concerning for cellulitis. Induration extends down in the right lateral neck. No mastoid tenderness. Right TM and canal are normal.  Eyes: Conjunctivae are normal. Right eye exhibits no discharge. Left eye exhibits no discharge. No scleral icterus.  Neck: Normal range of motion. Neck supple. No thyromegaly present.  Cardiovascular: Normal rate, regular rhythm, normal heart sounds and intact distal pulses.   Pulmonary/Chest: Effort normal and breath sounds normal.  Abdominal: Soft. Bowel sounds are normal. She exhibits no distension. There is no tenderness. There is no rebound and no guarding.  Musculoskeletal: Normal range of motion.  Lymphadenopathy:    She has no cervical adenopathy.  Neurological: She is alert.  Skin: Skin is warm and dry. Capillary refill takes less than 2 seconds.  Vitals reviewed.    ED Treatments / Results  Labs (all labs ordered are listed, but only abnormal results are displayed) Labs Reviewed  CBC WITH DIFFERENTIAL/PLATELET - Abnormal; Notable for the following:       Result Value   WBC 11.9 (*)    Lymphs Abs 4.9 (*)    All other components within normal limits  BASIC METABOLIC PANEL - Abnormal; Notable for the following:    Glucose, Bld 107 (*)    Calcium 8.8 (*)    All other components within normal limits  AEROBIC CULTURE (SUPERFICIAL SPECIMEN)  CULTURE, BLOOD (ROUTINE X 2)  CULTURE, BLOOD (ROUTINE X 2)  BASIC METABOLIC PANEL  CBC  I-STAT CG4 LACTIC ACID, ED  CG4 I-STAT (LACTIC ACID)    EKG  EKG Interpretation None       Radiology No results found.  Procedures .Marland KitchenIncision and Drainage Date/Time: 11/22/2015 3:50 AM Performed by: Demetrios Loll T Authorized by: Demetrios Loll T   Consent:    Consent  obtained:  Verbal   Consent given by:  Patient   Risks discussed:  Bleeding, incomplete drainage, infection, damage to other organs and pain   Alternatives discussed:  No treatment Location:    Type:  Abscess   Location:  Head   Head location:  R external ear Pre-procedure details:    Skin preparation:  Betadine Anesthesia (see MAR for exact dosages):    Anesthesia method:  Local infiltration   Local anesthetic:  Lidocaine 1% w/o epi Procedure type:    Complexity:  Simple Procedure details:    Needle  aspiration: no     Incision types:  Stab incision   Incision depth:  Dermal   Drainage:  Purulent and bloody   Drainage amount:  Moderate   Packing materials:  None Post-procedure details:    Patient tolerance of procedure:  Tolerated well, no immediate complications   (including critical care time)  Medications Ordered in ED Medications  lidocaine (PF) (XYLOCAINE) 1 % injection (not administered)  povidone-iodine (BETADINE) 10 % external solution (not administered)  sodium chloride 0.9 % bolus 1,000 mL (0 mLs Intravenous Stopped 11/17/15 1239)  iopamidol (ISOVUE-300) 61 % injection 75 mL (75 mLs Intravenous Contrast Given 11/17/15 1145)  vancomycin (VANCOCIN) IVPB 1000 mg/200 mL premix (0 mg Intravenous Stopped 11/17/15 1556)  piperacillin-tazobactam (ZOSYN) IVPB 3.375 g (0 g Intravenous Stopped 11/17/15 1443)  sodium chloride 0.9 % bolus 250 mL (250 mLs Intravenous Given 11/18/15 0955)     Initial Impression / Assessment and Plan / ED Course  I have reviewed the triage vital signs and the nursing notes.  Pertinent labs & imaging results that were available during my care of the patient were reviewed by me and considered in my medical decision making (see chart for details).  Clinical Course   Patient presents to the ED by referral from PCP for concerning right ear cellulitis. Exam consistent with cellulitis of right ear and area of fluctuance. Mild leukocytosis noted. All  other labs unremarkable. CT of soft neck showed 1cm dermal abscess just posterior to right ear lobe. Discussed patient with ENT who recommends I and D and starting iv abx. I and D was performed with purulent discharge and would culture was sent. She was started on vnac and zosyn. Hospitalist was called to admit patient. I spoke with Dr. Pamala Hurryrlando who agrees to admit patient. Patient was also discused and seen by Dr.Tegeler who is agreeable to the above plan. Patient is afebrile and hemodynamically stable. Agreeable to admission.   Final Clinical Impressions(s) / ED Diagnoses   Final diagnoses:  Cellulitis of other specified site    New Prescriptions Discharge Medication List as of 11/18/2015  2:12 PM    START taking these medications   Details  clindamycin (CLEOCIN) 150 MG capsule Take 3 capsules (450 mg total) by mouth every 8 (eight) hours., Starting Sat 11/18/2015, Until Fri 11/24/2015, Normal         Rise MuKenneth T Dmitri Pettigrew, PA-C 11/22/15 [id number]    Canary Brimhristopher J Tegeler, MD 11/22/15 2017

## 2015-11-22 LAB — AEROBIC CULTURE W GRAM STAIN (SUPERFICIAL SPECIMEN)

## 2015-11-22 LAB — CULTURE, BLOOD (ROUTINE X 2)
CULTURE: NO GROWTH
Culture: NO GROWTH

## 2015-11-22 LAB — AEROBIC CULTURE  (SUPERFICIAL SPECIMEN): SPECIAL REQUESTS: NORMAL

## 2015-11-24 DIAGNOSIS — L729 Follicular cyst of the skin and subcutaneous tissue, unspecified: Secondary | ICD-10-CM | POA: Insufficient documentation

## 2015-11-24 DIAGNOSIS — L089 Local infection of the skin and subcutaneous tissue, unspecified: Secondary | ICD-10-CM | POA: Insufficient documentation

## 2018-02-05 ENCOUNTER — Ambulatory Visit (HOSPITAL_COMMUNITY)
Admission: RE | Admit: 2018-02-05 | Discharge: 2018-02-05 | Disposition: A | Discharge status: Home / Self Care | Payer: 59 | Source: Ambulatory Visit | Attending: Physician Assistant | Admitting: Physician Assistant

## 2018-02-05 ENCOUNTER — Other Ambulatory Visit (HOSPITAL_COMMUNITY): Payer: Self-pay | Admitting: Physician Assistant

## 2018-02-05 DIAGNOSIS — M545 Low back pain, unspecified: Secondary | ICD-10-CM

## 2018-02-05 DIAGNOSIS — M25551 Pain in right hip: Secondary | ICD-10-CM | POA: Diagnosis present

## 2018-02-05 DIAGNOSIS — M25552 Pain in left hip: Secondary | ICD-10-CM | POA: Diagnosis present

## 2018-05-04 ENCOUNTER — Other Ambulatory Visit: Payer: Self-pay

## 2018-05-04 DIAGNOSIS — I872 Venous insufficiency (chronic) (peripheral): Secondary | ICD-10-CM

## 2018-05-07 ENCOUNTER — Other Ambulatory Visit: Payer: Self-pay

## 2018-05-07 ENCOUNTER — Ambulatory Visit (HOSPITAL_COMMUNITY)
Admission: RE | Admit: 2018-05-07 | Discharge: 2018-05-07 | Disposition: A | Discharge status: Home / Self Care | Payer: 59 | Source: Ambulatory Visit | Attending: Family | Admitting: Family

## 2018-05-07 ENCOUNTER — Ambulatory Visit (INDEPENDENT_AMBULATORY_CARE_PROVIDER_SITE_OTHER)
Admission: RE | Admit: 2018-05-07 | Discharge: 2018-05-07 | Disposition: A | Discharge status: Home / Self Care | Payer: 59 | Source: Ambulatory Visit | Attending: Family | Admitting: Family

## 2018-05-07 ENCOUNTER — Ambulatory Visit: Payer: 59 | Admitting: Vascular Surgery

## 2018-05-07 ENCOUNTER — Telehealth: Payer: Self-pay | Admitting: Cardiovascular Disease

## 2018-05-07 ENCOUNTER — Encounter: Payer: Self-pay | Admitting: *Deleted

## 2018-05-07 ENCOUNTER — Encounter: Payer: Self-pay | Admitting: Vascular Surgery

## 2018-05-07 ENCOUNTER — Ambulatory Visit (INDEPENDENT_AMBULATORY_CARE_PROVIDER_SITE_OTHER)
Admission: RE | Admit: 2018-05-07 | Discharge: 2018-05-07 | Disposition: A | Discharge status: Home / Self Care | Payer: 59 | Source: Ambulatory Visit | Attending: Vascular Surgery | Admitting: Vascular Surgery

## 2018-05-07 ENCOUNTER — Other Ambulatory Visit (HOSPITAL_COMMUNITY): Payer: Self-pay | Admitting: Vascular Surgery

## 2018-05-07 VITALS — BP 102/62 | HR 68 | Temp 97.1°F | Resp 18 | Ht 64.0 in | Wt 136.0 lb

## 2018-05-07 DIAGNOSIS — I739 Peripheral vascular disease, unspecified: Secondary | ICD-10-CM

## 2018-05-07 DIAGNOSIS — I771 Stricture of artery: Secondary | ICD-10-CM

## 2018-05-07 DIAGNOSIS — I872 Venous insufficiency (chronic) (peripheral): Secondary | ICD-10-CM

## 2018-05-07 MED ORDER — SIMVASTATIN 10 MG PO TABS: 10.0000 mg | ORAL_TABLET | Freq: Every day | ORAL | 12 refills | Status: DC

## 2018-05-07 MED ORDER — ASPIRIN EC 325 MG PO TBEC: 325.0000 mg | DELAYED_RELEASE_TABLET | Freq: Every day | ORAL | 0 refills | Status: DC

## 2018-05-07 NOTE — Telephone Encounter (Signed)
Virtual Visit Pre-Appointment Phone Call  "(Name), I am calling you today to discuss your upcoming appointment. We are currently trying to limit exposure to the virus that causes COVID-19 by seeing patients at home rather than in the office."  1. "What is the BEST phone number to call the day of the visit?" -    [phone] 2. Do you have or have access to (through a family member/friend) a smartphone with video capability that we can use for your visit?" a. If yes - list this number in appt notes as cell (if different from BEST phone #) and list the appointment type as a VIDEO visit in appointment notes b. If no - list the appointment type as a PHONE visit in appointment notes  3. Confirm consent - "In the setting of the current Covid19 crisis, you are scheduled for a (phone or video) visit with your provider on (date) at (time).  Just as we do with many in-office visits, in order for you to participate in this visit, we must obtain consent.  If you'd like, I can send this to your mychart (if signed up) or email for you to review.  Otherwise, I can obtain your verbal consent now.  All virtual visits are billed to your insurance company just like a normal visit would be.  By agreeing to a virtual visit, we'd like you to understand that the technology does not allow for your provider to perform an examination, and thus may limit your provider's ability to fully assess your condition. If your provider identifies any concerns that need to be evaluated in person, we will make arrangements to do so.  Finally, though the technology is pretty good, we cannot assure that it will always work on either your or our end, and in the setting of a video visit, we may have to convert it to a phone-only visit.  In either situation, we cannot ensure that we have a secure connection.  Are you willing to proceed?" STAFF: Did the patient verbally acknowledge consent to telehealth visit? Document YES/NO here:  YES    4. Advise patient to be prepared - "Two hours prior to your appointment, go ahead and check your blood pressure, pulse, oxygen saturation, and your weight (if you have the equipment to check those) and write them all down. When your visit starts, your provider will ask you for this information. If you have an Apple Watch or Kardia device, please plan to have heart rate information ready on the day of your appointment. Please have a pen and paper handy nearby the day of the visit as well."  5. Give patient instructions for MyChart download to smartphone OR Doximity/Doxy.me as below if video visit (depending on what platform provider is using)  6. Inform patient they will receive a phone call 15 minutes prior to their appointment time (may be from unknown caller ID) so they should be prepared to answer    TELEPHONE CALL NOTE  Andrea Fleming has been deemed a candidate for a follow-up tele-health visit to limit community exposure during the Covid-19 pandemic. I spoke with the patient via phone to ensure availability of phone/video source, confirm preferred email & phone number, and discuss instructions and expectations.  I reminded Andrea Fleming to be prepared with any vital sign and/or heart rhythm information that could potentially be obtained via home monitoring, at the time of her visit. I reminded Andrea Fleming to expect a phone call prior to  her visit.  Donata DuffVicky T Slaughter 05/07/2018 1:01 PM   INSTRUCTIONS FOR DOWNLOADING THE MYCHART APP TO SMARTPHONE  - The patient must first make sure to have activated MyChart and know their login information - If Apple, go to [employer] Store and type in MyChart in the search bar and download the app. If Android, ask patient to go to [employer] Play Store and type in [place] in the search bar and download the app. The app is free but as with any other app downloads, their phone may require them to verify saved payment information or Apple/Android password.  - The  patient will need to then log into the app with their MyChart username and password, and select [facility] as their healthcare provider to link the account. When it is time for your visit, go to the MyChart app, find appointments, and click Begin Video Visit. Be sure to Select Allow for your device to access the Microphone and Camera for your visit. You will then be connected, and your provider will be with you shortly.  **If they have any issues connecting, or need assistance please contact MyChart service desk [phone])**  **If using a computer, in order to ensure the best quality for their visit they will need to use either of the following Internet Browsers: [employer] Edge, or Google Chrome**  IF USING DOXIMITY or DOXY.ME - The patient will receive a link just prior to their visit by text.     FULL LENGTH CONSENT FOR TELE-HEALTH VISIT   I hereby voluntarily request, consent and authorize [facility] and its employed or contracted physicians, physician assistants, nurse practitioners or other licensed health care professionals (the Practitioner), to provide me with telemedicine health care services (the Services") as deemed necessary by the treating Practitioner. I acknowledge and consent to receive the Services by the Practitioner via telemedicine. I understand that the telemedicine visit will involve communicating with the Practitioner through live audiovisual communication technology and the disclosure of certain medical information by electronic transmission. I acknowledge that I have been given the opportunity to request an in-person assessment or other available alternative prior to the telemedicine visit and am voluntarily participating in the telemedicine visit.  I understand that I have the right to withhold or withdraw my consent to the use of telemedicine in the course of my care at any time, without affecting my right to future care or treatment, and that the  Practitioner or I may terminate the telemedicine visit at any time. I understand that I have the right to inspect all information obtained and/or recorded in the course of the telemedicine visit and may receive copies of available information for a reasonable fee.  I understand that some of the potential risks of receiving the Services via telemedicine include:   Delay or interruption in medical evaluation due to technological equipment failure or disruption;  Information transmitted may not be sufficient (e.g. poor resolution of images) to allow for appropriate medical decision making by the Practitioner; and/or   In rare instances, security protocols could fail, causing a breach of personal health information.  Furthermore, I acknowledge that it is my responsibility to provide information about my medical history, conditions and care that is complete and accurate to the best of my ability. I acknowledge that Practitioner's advice, recommendations, and/or decision may be based on factors not within their control, such as incomplete or inaccurate data provided by me or distortions of diagnostic images or specimens that may result from electronic transmissions. I  understand that the practice of medicine is not an exact science and that Practitioner makes no warranties or guarantees regarding treatment outcomes. I acknowledge that I will receive a copy of this consent concurrently upon execution via email to the email address I last provided but may also request a printed copy by calling the office of [facility].    I understand that my insurance will be billed for this visit.   I have read or had this consent read to me.  I understand the contents of this consent, which adequately explains the benefits and risks of the Services being provided via telemedicine.   I have been provided ample opportunity to ask questions regarding this consent and the Services and have had my questions answered to my  satisfaction.  I give my informed consent for the services to be provided through the use of telemedicine in my medical care  By participating in this telemedicine visit I agree to the above.

## 2018-05-07 NOTE — Progress Notes (Signed)
Referring Physician: Dr Erskine Emery  Patient name: Andrea Fleming MRN: [id number] DOB: [birth year] Sex: female  REASON FOR CONSULT: right fifth toe gangrene  HPI: Andrea Fleming is a 58 y.o. female, with a 2-year history of worsening lower extremity pain especially hip and buttocks with ambulation.  Injured her right fifth toe in December 2019.  This has failed to heal.  The wound has gradually gotten worse.  She was initially seen by rheumatology followed by dermatology followed by podiatry where an ABI was done and she was referred here for further evaluation.  She smoked in the past and quit 10 years ago.  However she also has begun smoking again about 1/4 pack of cigarettes per day over the last month.  We discussed quitting.  She is smoking more now due to stress from COVID.  She denies shortness of breath or chest pain.  She does not know if she could climb a flight of steps without getting short of breath.  She denies prior stroke.  Her only previous abdominal operation was a laparoscopic procedure for an ectopic.  She is not currently on aspirin or a cholesterol medication.  She does not describe rest pain in her right foot.  However, she states that the toe hurts continuous.  She is taking tramadol for this with some mild improvement.  He currently is doing local wound care on the fifth toe with cortisone cream and Betadine paint.  She denies any upper extremity exertional fatigue.  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  She denies prior history of abdominal aortic aneurysm.  She has no family history of abdominal aortic aneurysm.  Past Medical History:  Diagnosis Date  . Allergy   . Condyloma 1993  . Depression   . Hyperlipidemia    Past Surgical History:  Procedure Laterality Date  . Ear drum surg    . PELVIC LAPAROSCOPY  1997   rt ectopic    Family History  Problem Relation Age of Onset  . Stroke Mother   . Hypertension Mother     SOCIAL HISTORY: Social  History   Socioeconomic History  . Marital status: Single    Spouse name: Not on file  . Number of children: Not on file  . Years of education: Not on file  . Highest education level: Not on file  Occupational History  . Not on file  Social Needs  . Financial resource strain: Not on file  . Food insecurity:    Worry: Not on file    Inability: Not on file  . Transportation needs:    Medical: Not on file    Non-medical: Not on file  Tobacco Use  . Smoking status: Current Every Day Smoker    Packs/day: 0.25    Types: Cigarettes  . Smokeless tobacco: Never Used  Substance and Sexual Activity  . Alcohol use: Yes    Comment: occ. wine  . Drug use: No  . Sexual activity: Yes    Birth control/protection: Pill  Lifestyle  . Physical activity:    Days per week: Not on file    Minutes per session: Not on file  . Stress: Not on file  Relationships  . Social connections:    Talks on phone: Not on file    Gets together: Not on file    Attends religious service: Not on file    Active member of club or organization: Not on file    Attends meetings of clubs  or organizations: Not on file    Relationship status: Not on file  . Intimate partner violence:    Fear of current or ex partner: Not on file    Emotionally abused: Not on file    Physically abused: Not on file    Forced sexual activity: Not on file  Other Topics Concern  . Not on file  Social History Narrative  . Not on file    Allergies  Allergen Reactions  . Clindamycin Other (See Comments)    other  . Sulfa Antibiotics     GI upset   . Sulfamethoxazole Nausea And Vomiting    Current Outpatient Medications  Medication Sig Dispense Refill  . ALPRAZolam (XANAX) 0.5 MG tablet Take 0.5 mg by mouth at bedtime as needed.      . celecoxib (CELEBREX) 200 MG capsule     . cetirizine (ZYRTEC) 10 MG tablet Take 10 mg by mouth daily.      . citalopram (CELEXA) 10 MG tablet Take 10 mg by mouth daily.     . meloxicam  (MOBIC) 15 MG tablet TK 1 T PO QD    . pentoxifylline (TRENTAL) 400 MG CR tablet TK 1 T PO  TID WITH FOOD    . traMADol (ULTRAM) 50 MG tablet TK 1 T PO Q 4 HOURS. MAX OF 6 PER DAY     No current facility-administered medications for this visit.     ROS:   General:  No weight loss, Fever, chills  HEENT: No recent headaches, no nasal bleeding, no visual changes, no sore throat  Neurologic: No dizziness, blackouts, seizures. No recent symptoms of stroke or mini- stroke. No recent episodes of slurred speech, or temporary blindness.  Cardiac: No recent episodes of chest pain/pressure, no shortness of breath at rest.  No shortness of breath with exertion.  Denies history of atrial fibrillation or irregular heartbeat  Vascular: No history of rest pain in feet.  No history of claudication.  + history of non-healing ulcer, No history of DVT   Pulmonary: No home oxygen, no productive cough, no hemoptysis,  No asthma or wheezing  Musculoskeletal:  [ ]  Arthritis, [ ]  Low back pain,  [ ]  Joint pain  Hematologic:No history of hypercoagulable state.  No history of easy bleeding.  No history of anemia  Gastrointestinal: No hematochezia or melena,  No gastroesophageal reflux, no trouble swallowing  Urinary: [ ]  chronic Kidney disease, [ ]  on HD - [ ]  MWF or [ ]  TTHS, [ ]  Burning with urination, [ ]  Frequent urination, [ ]  Difficulty urinating;   Skin: No rashes  Psychological: No history of anxiety,  No history of depression   Physical Examination  Vitals:   05/07/18 0916  BP: 102/62  Pulse: 68  Resp: 18  Temp: (!) 97.1 F (36.2 C)  SpO2: 98%  Weight: 136 lb (61.7 kg)  Height: 5\' 4"  (1.626 m)    Body mass index is 23.34 kg/m.  General:  Alert and oriented, no acute distress HEENT: Normal Neck: No bruit or JVD Pulmonary: Clear to auscultation bilaterally Cardiac: Regular Rate and Rhythm without murmur Abdomen: Soft, non-tender, non-distended, no mass, palpable upper aortic  pulsation Skin: No rash, gangrene right 5th toe Extremity Pulses:  2+ radial, brachial right side absent left brachial radial pulse, absent femoral, dorsalis pedis, posterior tibial pulses bilaterally Musculoskeletal: No deformity or edema  Neurologic: Upper and lower extremity motor 5/5 and symmetric  DATA:  Patient had bilateral ABIs performed today  which were 0.4 bilaterally.  Also noted was a discrepancy in brachial arm pressure.  Right side was 143 left side was 104.  Arterial duplex showed monophasic flow in the lower extremities bilaterally suggestive of aortic occlusion.  Not clearly defined due to overlying bowel gas.  Patient also had a carotid duplex scan today which showed left subclavian artery stenosis with reversal of flow in the left vertebral artery.  Right subclavian was patent.  Antegrade right vertebral flow.  No significant internal carotid artery stenosis.  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  In light of this we will obtain a CT angios with lower extremity runoff for operative planning purposes.  Additionally she will be scheduled for cardiac risk gratification and a stress test anticipating that she would need aortobifemoral bypass.  She was informed today she is at risk of limb loss.  She does have evidence of peripheral arterial disease.  She will try to continue to quit smoking.  She was started today on aspirin 325 mg once a day as well as simvastatin 20 mg nightly  2.  Left subclavian stenosis asymptomatic currently.  If patient develops symptoms of dizziness or left arm exertional fatigue we would evaluate this further.  Otherwise main thing for right now would be measure blood pressure only in the right arm as left arm is going to be  artificially low  The patient will follow-up with me in 1 week after her CT angiogram hopefully we can get her cardiac stress test done as well.   Fabienne Bruns, MD [facility] Office: [phone] Pager: [phone]

## 2018-05-08 ENCOUNTER — Telehealth (INDEPENDENT_AMBULATORY_CARE_PROVIDER_SITE_OTHER): Payer: 59 | Admitting: Cardiovascular Disease

## 2018-05-08 ENCOUNTER — Encounter: Payer: Self-pay | Admitting: Cardiovascular Disease

## 2018-05-08 VITALS — BP 101/60 | Ht 64.0 in | Wt 134.0 lb

## 2018-05-08 DIAGNOSIS — Z01818 Encounter for other preprocedural examination: Secondary | ICD-10-CM

## 2018-05-08 DIAGNOSIS — Z720 Tobacco use: Secondary | ICD-10-CM

## 2018-05-08 DIAGNOSIS — I739 Peripheral vascular disease, unspecified: Secondary | ICD-10-CM | POA: Diagnosis not present

## 2018-05-08 NOTE — Progress Notes (Signed)
Virtual Visit via Telephone Note   This visit type was conducted due to national recommendations for restrictions regarding the COVID-19 Pandemic (e.g. social distancing) in an effort to limit this patient's exposure and mitigate transmission in our community.  Due to her co-morbid illnesses, this patient is at least at moderate risk for complications without adequate follow up.  This format is felt to be most appropriate for this patient at this time.  The patient did not have access to video technology/had technical difficulties with video requiring transitioning to audio format only (telephone).  All issues noted in this document were discussed and addressed.  No physical exam could be performed with this format.  Please refer to the patient's chart for her  consent to telehealth for [facility].   Date:  05/08/2018   ID:  Ardis Hughs, DOB 1960-02-18, MRN [id number]  Patient Location: Home Provider Location: Home  PCP:  Elfredia Nevins, MD  Cardiologist:  No primary care provider on file.  Electrophysiologist:  None   Evaluation Performed:  Consultation - Camron Essman Reich was referred by Dr. Darrick Penna for the evaluation of preoperative risk stratification.  Chief Complaint:  Preoperative risk stratification.  History of Present Illness:    XENA PROPST is a 58 y.o. female with a h/o chronic occlusion of infrarenal abdominal aorta with hip/buttock claudication and now gangrenous right fifth toe at risk of limb loss as well as asymptomatic left subclavian artery stenosis. She has a long h/o tobacco abuse.  She saw Dr. Darrick Penna yesterday and he feels she will require aortobifemoral bypass reconstruction. She was started on ASA and statin therapy.  Legs and hips hurting very badly.  Had initially seen a rheumatologist and was given steroids. Was put on Celebrex and Tramadol by PCP. She stubbed her toe and it was initially felt to be psoriasis. She then saw a dermatologist. She was then  prescribed nitro paste which caused a burning sensation of the foot with throbbing. She then went and saw a podiatrist on her own and went to [facility] for ultrasounds. She was then sent to vascular surgery.  She lives by herself. 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 She used to work for the [employer].  She denies chest pain and left arm pain. She denies left arm weakness. When walking from her house to her mailbox, her hips and legs hurt so badly it makes her have to catch her breath. She has tingling/numbness on the bottom of both soles and occasionally the calves. She denies leg swelling, orthopnea, and PND. She denies palpitations.  The patient does not have symptoms concerning for COVID-19 infection (fever, chills, cough, or new shortness of breath).    Past Medical History:  Diagnosis Date  . Allergy   . Condyloma 1993  . Depression   . Hyperlipidemia    Past Surgical History:  Procedure Laterality Date  . Ear drum surg    . PELVIC LAPAROSCOPY  1997   rt ectopic     Current Meds  Medication Sig  . ALPRAZolam (XANAX) 0.5 MG tablet Take 0.5 mg by mouth at bedtime as needed.    Marland Kitchen aspirin EC 325 MG tablet Take 1 tablet (325 mg total) by mouth daily.  . celecoxib (CELEBREX) 200 MG capsule   . cetirizine (ZYRTEC) 10 MG tablet Take 10 mg by mouth daily.    . citalopram (CELEXA) 10 MG tablet Take 10 mg by mouth daily.   Marland Kitchen  meloxicam (MOBIC) 15 MG tablet TK 1 T PO QD  . pentoxifylline (TRENTAL) 400 MG CR tablet TK 1 T PO  TID WITH FOOD  . simvastatin (ZOCOR) 10 MG tablet Take 1 tablet (10 mg total) by mouth at bedtime.  . traMADol (ULTRAM) 50 MG tablet TK 1 T PO Q 4 HOURS. MAX OF 6 PER DAY     Allergies:   Clindamycin; Sulfa antibiotics; and Sulfamethoxazole   Social History   Tobacco Use  . Smoking status: Current Every Day Smoker    Packs/day: 0.25    Types: Cigarettes  . Smokeless tobacco: Never Used  Substance Use Topics  . Alcohol use: Yes     Comment: occ. wine  . Drug use: No     Family Hx: The patient's family history includes Hypertension in her mother; Stroke in her mother.  ROS:   Please see the history of present illness.     All other systems reviewed and are negative.   Prior CV studies:   The following studies were reviewed today:  Carotid Dopplers 05/07/18:  Summary: Right Carotid: There is no evidence of stenosis in the right ICA.  Left Carotid: There is no evidence of stenosis in the left ICA.  Vertebrals:  Right vertebral artery demonstrates antegrade flow. Left vertebral              artery demonstrates retrograde flow. Subclavians: Left subclavian artery was stenotic. Normal flow hemodynamics were              seen in the right subclavian artery.  Lower extremity arterial Duplex study (right), 05/07/18:  Summary: Right: The right lower extremity demonstrates monophasic flow throughout. The proximal aorta is patent. The mid aorta appears occluded. Unable to adequately visualize the distal aorta due to bowel gas.  Labs/Other Tests and Data Reviewed:    EKG:  No ECG reviewed.  Recent Labs: No results found for requested labs within last 8760 hours.   Recent Lipid Panel No results found for: CHOL, TRIG, HDL, CHOLHDL, LDLCALC, LDLDIRECT  Wt Readings from Last 3 Encounters:  05/08/18 134 lb (60.8 kg)  05/07/18 136 lb (61.7 kg)  11/17/15 128 lb (58.1 kg)     Objective:    Vital Signs:  BP 101/60   Ht 5\' 4"  (1.626 m)   Wt 134 lb (60.8 kg)   LMP 08/17/2011   BMI 23.00 kg/m    VITAL SIGNS:  reviewed  ASSESSMENT & PLAN:    1. Preoperative risk stratification: Given her multiple comorbidities, she is at an elevated risk of having CAD. In order to help risk stratify, I will proceed with a Lexiscan Myoview to assess for significant ischemia. Recently started on ASA and statin therapy.  2. PVD: She has chronic occlusion of infrarenal abdominal aorta with hip/buttock claudication and  now gangrenous right fifth toe at risk of limb loss as well as asymptomatic left subclavian artery stenosis. She has a long h/o tobacco abuse. She saw Dr. Darrick PennaFields yesterday and he feels she will require aortobifemoral bypass reconstruction. She was started on ASA and statin therapy. She is also on Trental.  3. Tobacco abuse: Needs complete cessation.   COVID-19 Education: The signs and symptoms of COVID-19 were discussed with the patient and how to seek care for testing (follow up with PCP or arrange E-visit).  The importance of social distancing was discussed today.  Time:   Today, I have spent 30 minutes with the patient with telehealth technology discussing the above  problems.     Medication Adjustments/Labs and Tests Ordered: Current medicines are reviewed at length with the patient today.  Concerns regarding medicines are outlined above.   Tests Ordered: No orders of the defined types were placed in this encounter.   Medication Changes: No orders of the defined types were placed in this encounter.   Disposition:  Follow up to be determined after stress test.  Signed, Prentice Docker, MD  05/08/2018 12:24 PM    [facility]

## 2018-05-08 NOTE — Progress Notes (Signed)
Medication Instructions:  Your physician recommends that you continue on your current medications as directed. Please refer to the Current Medication list given to you today.   Labwork: none  Testing/Procedures: Your physician has requested that you have a lexiscan myoview. For further information please visit [email or web address]. Please follow instruction sheet, as given.    Follow-Up: Your physician recommends that you schedule a follow-up appointment in: to be determined    Any Other Special Instructions Will Be Listed Below (If Applicable).     If you need a refill on your cardiac medications before your next appointment, please call your pharmacy.   

## 2018-05-08 NOTE — Addendum Note (Signed)
Addended by: Abelino Derrick R on: 05/08/2018 12:58 PM   Modules accepted: Orders

## 2018-05-11 ENCOUNTER — Telehealth: Payer: Self-pay | Admitting: *Deleted

## 2018-05-11 ENCOUNTER — Ambulatory Visit: Payer: 59 | Admitting: Family

## 2018-05-11 NOTE — Telephone Encounter (Signed)
Patient called stated her toes busted open and was having "throbbing pain no relief with Tramadol" She states she is also taking ASA and Mobic with no relief. Appointment available today to see NP at 3 pm. She states she can not get in today. I informed her patient has to be seen for pain Rx and would like to help her get improved pain relief since she is also scheduled for cardiac stress test on 05/12/2018. She stated she can not get into the office today.

## 2018-05-12 ENCOUNTER — Ambulatory Visit (HOSPITAL_COMMUNITY)
Admission: RE | Admit: 2018-05-12 | Discharge: 2018-05-12 | Disposition: A | Discharge status: Home / Self Care | Payer: 59 | Source: Ambulatory Visit | Attending: Cardiovascular Disease | Admitting: Cardiovascular Disease

## 2018-05-12 ENCOUNTER — Other Ambulatory Visit: Payer: Self-pay

## 2018-05-12 ENCOUNTER — Encounter (HOSPITAL_COMMUNITY)
Admission: RE | Admit: 2018-05-12 | Discharge: 2018-05-12 | Disposition: A | Discharge status: Home / Self Care | Payer: 59 | Source: Ambulatory Visit | Attending: Cardiovascular Disease | Admitting: Cardiovascular Disease

## 2018-05-12 DIAGNOSIS — Z01818 Encounter for other preprocedural examination: Secondary | ICD-10-CM | POA: Insufficient documentation

## 2018-05-12 DIAGNOSIS — Z01810 Encounter for preprocedural cardiovascular examination: Secondary | ICD-10-CM | POA: Diagnosis not present

## 2018-05-12 LAB — NM MYOCAR MULTI W/SPECT W/WALL MOTION / EF
LV dias vol: 44 mL (ref 46–106)
LV sys vol: 12 mL
Peak HR: 126 {beats}/min
RATE: 0.27
Rest HR: 88 {beats}/min
SDS: 0
SRS: 0
SSS: 0
TID: 1.43

## 2018-05-12 MED ORDER — TECHNETIUM TC 99M TETROFOSMIN IV KIT
30.0000 | PACK | Freq: Once | INTRAVENOUS | Status: AC | PRN
  Administered 2018-05-12: 33 via INTRAVENOUS

## 2018-05-12 MED ORDER — REGADENOSON 0.4 MG/5ML IV SOLN
INTRAVENOUS | Status: AC
  Administered 2018-05-12: 0.4 mg via INTRAVENOUS
  Filled 2018-05-12: qty 5

## 2018-05-12 MED ORDER — TECHNETIUM TC 99M TETROFOSMIN IV KIT
10.0000 | PACK | Freq: Once | INTRAVENOUS | Status: AC | PRN
  Administered 2018-05-12: 10 via INTRAVENOUS

## 2018-05-12 MED ORDER — SODIUM CHLORIDE 0.9% FLUSH
INTRAVENOUS | Status: AC
  Administered 2018-05-12: 10 mL via INTRAVENOUS
  Filled 2018-05-12: qty 10

## 2018-05-13 ENCOUNTER — Telehealth (HOSPITAL_COMMUNITY): Payer: Self-pay | Admitting: Rehabilitation

## 2018-05-13 NOTE — Telephone Encounter (Signed)
The above patient or their representative was contacted and gave the following answers to these questions:         Do you have any of the following symptoms? No  Fever                    Cough                   Shortness of breath  Do  you have any of the following other symptoms? No    muscle pain         vomiting,        diarrhea        rash         weakness        red eye        abdominal pain         bruising          bruising or bleeding              joint pain           severe headache    Have you been in contact with someone who was or has been sick in the past 2 weeks? No  Yes                 Unsure                         Unable to assess   Does the person that you were in contact with have any of the following symptoms?   Cough         shortness of breath           muscle pain         vomiting,            diarrhea            rash            weakness           fever            red eye           abdominal pain           bruising  or  bleeding                joint pain                severe headache               Have you  or someone you have been in contact with traveled internationally in th last month? No        If yes, which countries?   Have you  or someone you have been in contact with traveled outside [place] in th last month?  No       If yes, which state and city?   COMMENTS OR ACTION PLAN FOR THIS PATIENT:  Pt instructed to wear mask to appt        

## 2018-05-14 ENCOUNTER — Ambulatory Visit: Payer: 59 | Admitting: Vascular Surgery

## 2018-05-14 ENCOUNTER — Ambulatory Visit
Admission: RE | Admit: 2018-05-14 | Discharge: 2018-05-14 | Disposition: A | Discharge status: Home / Self Care | Payer: 59 | Source: Ambulatory Visit | Attending: Vascular Surgery | Admitting: Vascular Surgery

## 2018-05-14 ENCOUNTER — Encounter: Payer: Self-pay | Admitting: Vascular Surgery

## 2018-05-14 ENCOUNTER — Other Ambulatory Visit: Payer: Self-pay

## 2018-05-14 ENCOUNTER — Encounter: Payer: Self-pay | Admitting: *Deleted

## 2018-05-14 VITALS — BP 89/68 | HR 100 | Temp 97.5°F | Resp 20 | Ht 64.0 in | Wt 135.5 lb

## 2018-05-14 DIAGNOSIS — I739 Peripheral vascular disease, unspecified: Secondary | ICD-10-CM | POA: Diagnosis not present

## 2018-05-14 MED ORDER — IOPAMIDOL (ISOVUE-370) INJECTION 76%
100.0000 mL | Freq: Once | INTRAVENOUS | Status: AC | PRN
  Administered 2018-05-14: 100 mL via INTRAVENOUS

## 2018-05-14 MED ORDER — TRAMADOL HCL 50 MG PO TABS: 50.0000 mg | ORAL_TABLET | Freq: Four times a day (QID) | ORAL | 0 refills | Status: DC | PRN

## 2018-05-14 NOTE — H&P (View-Only) (Signed)
Patient is a 58 year old female who returns for follow-up today.  She was seen 1 week ago for early gangrenous changes of her right fifth toe as well as short distance claudication.  Over the last week she had a CT angiogram of the abdomen and pelvis with runoff as well as a cardiac stress test.  Her cardiac stress test was negative.  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  There was no significant celiac or renal artery occlusive disease.  She had an anterior left renal vein.  She had single renal arteries bilaterally.  He states her symptoms are unchanged.  She still has pain in the right foot requiring tramadol.  She has very short distance claudication.  She is now on a statin and aspirin.  Review of systems: She denies shortness of breath.  She denies chest pain.  Social History   Socioeconomic History  . Marital status: Single    Spouse name: Not on file  . Number of children: Not on file  . Years of education: Not on file  . Highest education level: Not on file  Occupational History  . Not on file  Social Needs  . Financial resource strain: Not on file  . Food insecurity:    Worry: Not on file    Inability: Not on file  . Transportation needs:    Medical: Not on file    Non-medical: Not on file  Tobacco Use  . Smoking status: Current Every Day Smoker    Packs/day: 0.25    Types: Cigarettes  . Smokeless tobacco: Never Used  Substance and Sexual Activity  . Alcohol use: Yes    Comment: occ. wine  . Drug use: No  . Sexual activity: Yes    Birth control/protection: Pill  Lifestyle  . Physical activity:    Days per week: Not on file    Minutes per session: Not on file  . Stress: Not on file  Relationships  . Social connections:    Talks on phone: Not on file    Gets together: Not on file    Attends religious service: Not  on file    Active member of club or organization: Not on file    Attends meetings of clubs or organizations: Not on file    Relationship status: Not on file  . Intimate partner violence:    Fear of current or ex partner: Not on file    Emotionally abused: Not on file    Physically abused: Not on file    Forced sexual activity: Not on file  Other Topics Concern  . Not on file  Social History Narrative  . Not on file     Past Medical History:  Diagnosis Date  . Allergy   . Condyloma 1993  . Depression   . Hyperlipidemia    Past Surgical History:  Procedure Laterality Date  . Ear drum surg    . PELVIC LAPAROSCOPY  1997   rt ectopic    Current Outpatient Medications on File Prior to Visit  Medication Sig Dispense Refill  . ALPRAZolam (XANAX) 0.5 MG tablet Take 0.5 mg by mouth at bedtime as needed.      Marland Kitchen. aspirin EC 325 MG tablet Take 1 tablet (325 mg total) by mouth daily. 30 tablet 0  . celecoxib (CELEBREX) 200 MG capsule     .  cetirizine (ZYRTEC) 10 MG tablet Take 10 mg by mouth daily.      . citalopram (CELEXA) 10 MG tablet Take 10 mg by mouth daily.     . meloxicam (MOBIC) 15 MG tablet TK 1 T PO QD    . pentoxifylline (TRENTAL) 400 MG CR tablet TK 1 T PO  TID WITH FOOD    . simvastatin (ZOCOR) 10 MG tablet Take 1 tablet (10 mg total) by mouth at bedtime. 30 tablet 12   No current facility-administered medications on file prior to visit.    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  Risk benefits possible complications and procedure details including but not limited to bleeding infection  limb loss transfusion risk myocardial events all discussed with the patient today.  She understands and agrees to proceed.  Fabienne Bruns, MD [facility] Office: [phone] Pager: [phone]

## 2018-05-14 NOTE — Progress Notes (Signed)
Patient is a 58-year-old female who returns for follow-up today.  She was seen 1 week ago for early gangrenous changes of her right fifth toe as well as short distance claudication.  Over the last week she had a CT angiogram of the abdomen and pelvis with runoff as well as a cardiac stress test.  Her cardiac stress test was negative.  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  There was no significant celiac or renal artery occlusive disease.  She had an anterior left renal vein.  She had single renal arteries bilaterally.  He states her symptoms are unchanged.  She still has pain in the right foot requiring tramadol.  She has very short distance claudication.  She is now on a statin and aspirin.  Review of systems: She denies shortness of breath.  She denies chest pain.  Social History   Socioeconomic History  . Marital status: Single    Spouse name: Not on file  . Number of children: Not on file  . Years of education: Not on file  . Highest education level: Not on file  Occupational History  . Not on file  Social Needs  . Financial resource strain: Not on file  . Food insecurity:    Worry: Not on file    Inability: Not on file  . Transportation needs:    Medical: Not on file    Non-medical: Not on file  Tobacco Use  . Smoking status: Current Every Day Smoker    Packs/day: 0.25    Types: Cigarettes  . Smokeless tobacco: Never Used  Substance and Sexual Activity  . Alcohol use: Yes    Comment: occ. wine  . Drug use: No  . Sexual activity: Yes    Birth control/protection: Pill  Lifestyle  . Physical activity:    Days per week: Not on file    Minutes per session: Not on file  . Stress: Not on file  Relationships  . Social connections:    Talks on phone: Not on file    Gets together: Not on file    Attends religious service: Not  on file    Active member of club or organization: Not on file    Attends meetings of clubs or organizations: Not on file    Relationship status: Not on file  . Intimate partner violence:    Fear of current or ex partner: Not on file    Emotionally abused: Not on file    Physically abused: Not on file    Forced sexual activity: Not on file  Other Topics Concern  . Not on file  Social History Narrative  . Not on file     Past Medical History:  Diagnosis Date  . Allergy   . Condyloma 1993  . Depression   . Hyperlipidemia    Past Surgical History:  Procedure Laterality Date  . Ear drum surg    . PELVIC LAPAROSCOPY  1997   rt ectopic    Current Outpatient Medications on File Prior to Visit  Medication Sig Dispense Refill  . ALPRAZolam (XANAX) 0.5 MG tablet Take 0.5 mg by mouth at bedtime as needed.      . aspirin EC 325 MG tablet Take 1 tablet (325 mg total) by mouth daily. 30 tablet 0  . celecoxib (CELEBREX) 200 MG capsule     .   cetirizine (ZYRTEC) 10 MG tablet Take 10 mg by mouth daily.      . citalopram (CELEXA) 10 MG tablet Take 10 mg by mouth daily.     . meloxicam (MOBIC) 15 MG tablet TK 1 T PO QD    . pentoxifylline (TRENTAL) 400 MG CR tablet TK 1 T PO  TID WITH FOOD    . simvastatin (ZOCOR) 10 MG tablet Take 1 tablet (10 mg total) by mouth at bedtime. 30 tablet 12   No current facility-administered medications on file prior to visit.    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  Risk benefits possible complications and procedure details including but not limited to bleeding infection  limb loss transfusion risk myocardial events all discussed with the patient today.  She understands and agrees to proceed.  Fabienne Bruns, MD [facility] Office: [phone] Pager: [phone]

## 2018-05-15 ENCOUNTER — Other Ambulatory Visit: Payer: Self-pay | Admitting: *Deleted

## 2018-05-19 NOTE — Pre-Procedure Instructions (Signed)
Andrea Fleming  05/19/2018      [facility] [name], [place] - [address] [place] [id number] Phone: [phone] Fax: [phone]  [facility] [phone] - [place], [place] - [address] AT [facility] [id number]  [place] [id number] Phone: [phone] Fax: [phone]     Your procedure is scheduled on May 20th.  [facility] to [facility] Entrance "A" Admitting at 6:30 A.M.  Call this number if you have problems the morning of surgery:  [phone]   Remember:  Do not eat or drink after midnight.     Take these medicines the morning of surgery with A SIP OF WATER   Simvastatin (Zocor)  Citalopram (Celexa)  Follow your surgeon's instructions on when to stop Aspirin.  If no instructions were given by your surgeon then you will need to call the office to get those instructions.    7 days prior to surgery STOP taking any Aspirin (unless otherwise instructed by your surgeon), Aleve, Naproxen, Ibuprofen, Motrin, Advil, Goody's, BC's, all herbal medications, fish oil, and all vitamins.     Do not wear jewelry, make-up or nail polish.  Do not wear lotions, powders, or perfumes, or deodorant.  Do not shave 48 hours prior to surgery.    Do not bring valuables to the hospital.  [facility] is not responsible for any belongings or valuables.   [place]- Preparing For Surgery  Before surgery, you can play an important role. Because skin is not sterile, your skin needs to be as free of germs as possible. You can reduce the number of germs on your skin by washing with CHG (chlorahexidine gluconate) Soap before surgery.  CHG is an antiseptic cleaner which kills germs and bonds with the skin to continue killing germs even after washing.    Oral Hygiene is also important to reduce your risk of infection.  Remember - BRUSH YOUR TEETH THE MORNING OF SURGERY WITH YOUR REGULAR TOOTHPASTE  Please do not use if you have an  allergy to CHG or antibacterial soaps. If your skin becomes reddened/irritated stop using the CHG.  Do not shave (including legs and underarms) for at least 48 hours prior to first CHG shower. It is OK to shave your face.  Please follow these instructions carefully.   1. Shower the NIGHT BEFORE SURGERY and the MORNING OF SURGERY with CHG.   2. If you chose to wash your hair, wash your hair first as usual with your normal shampoo.  3. After you shampoo, rinse your hair and body thoroughly to remove the shampoo.  4. Use CHG as you would any other liquid soap. You can apply CHG directly to the skin and wash gently with a scrungie or a clean washcloth.   5. Apply the CHG Soap to your body ONLY FROM THE NECK DOWN.  Do not use on open wounds or open sores. Avoid contact with your eyes, ears, mouth and genitals (private parts). Wash Face and genitals (private parts)  with your normal soap.  6. Wash thoroughly, paying special attention to the area where your surgery will be performed.  7. Thoroughly rinse your body with warm water from the neck down.  8. DO NOT shower/wash with your normal soap after using and rinsing off the CHG Soap.  9. Pat yourself dry with a CLEAN TOWEL.  10. Wear CLEAN PAJAMAS to bed the night before surgery, wear comfortable clothes the morning  of surgery  11. Place CLEAN SHEETS on your bed the night of your first shower and DO NOT SLEEP WITH PETS.   Day of Surgery:  Do not apply any deodorants/lotions.  Please wear clean clothes to the hospital/surgery center.   Remember to brush your teeth WITH YOUR REGULAR TOOTHPASTE.   Contacts, dentures or bridgework may not be worn into surgery.  Leave your suitcase in the car.  After surgery it may be brought to your room.  For patients admitted to the hospital, discharge time will be determined by your treatment team.  Patients discharged the day of surgery will not be allowed to drive home.   Please read over the  following fact sheets that you were given. Coughing and Deep Breathing and Surgical Site Infection Prevention

## 2018-05-20 ENCOUNTER — Encounter (HOSPITAL_COMMUNITY)
Admission: RE | Admit: 2018-05-20 | Discharge: 2018-05-20 | Disposition: A | Discharge status: Home / Self Care | Payer: 59 | Source: Ambulatory Visit | Attending: Vascular Surgery | Admitting: Vascular Surgery

## 2018-05-20 ENCOUNTER — Encounter (HOSPITAL_COMMUNITY): Payer: Self-pay

## 2018-05-20 ENCOUNTER — Other Ambulatory Visit: Payer: Self-pay

## 2018-05-20 DIAGNOSIS — Z01812 Encounter for preprocedural laboratory examination: Secondary | ICD-10-CM | POA: Diagnosis present

## 2018-05-20 DIAGNOSIS — Z0279 Encounter for issue of other medical certificate: Secondary | ICD-10-CM

## 2018-05-20 LAB — COMPREHENSIVE METABOLIC PANEL WITH GFR
ALT: 17 U/L (ref 0–44)
AST: 18 U/L (ref 15–41)
Albumin: 3.8 g/dL (ref 3.5–5.0)
Alkaline Phosphatase: 126 U/L (ref 38–126)
Anion gap: 12 (ref 5–15)
BUN: 7 mg/dL (ref 6–20)
CO2: 25 mmol/L (ref 22–32)
Calcium: 9.3 mg/dL (ref 8.9–10.3)
Chloride: 104 mmol/L (ref 98–111)
Creatinine, Ser: 0.79 mg/dL (ref 0.44–1.00)
GFR calc Af Amer: >60 mL/min
GFR calc non Af Amer: >60 mL/min
Glucose, Bld: 98 mg/dL (ref 70–99)
Potassium: 3.4 mmol/L — ABNORMAL LOW (ref 3.5–5.1)
Sodium: 141 mmol/L (ref 135–145)
Total Bilirubin: 0.6 mg/dL (ref 0.3–1.2)
Total Protein: 6.7 g/dL (ref 6.5–8.1)

## 2018-05-20 LAB — SURGICAL PCR SCREEN
MRSA, PCR: NEGATIVE
Staphylococcus aureus: NEGATIVE

## 2018-05-20 LAB — CBC
HCT: 43.2 % (ref 36.0–46.0)
Hemoglobin: 14.5 g/dL (ref 12.0–15.0)
MCH: 33 pg (ref 26.0–34.0)
MCHC: 33.6 g/dL (ref 30.0–36.0)
MCV: 98.2 fL (ref 80.0–100.0)
Platelets: 316 10*3/uL (ref 150–400)
RBC: 4.4 MIL/uL (ref 3.87–5.11)
RDW: 13.4 % (ref 11.5–15.5)
WBC: 10.7 10*3/uL — ABNORMAL HIGH (ref 4.0–10.5)
nRBC: 0 % (ref 0.0–0.2)

## 2018-05-20 LAB — APTT: aPTT: 30 seconds (ref 24–36)

## 2018-05-20 LAB — PROTIME-INR
INR: 1.1 (ref 0.8–1.2)
Prothrombin Time: 14.1 s (ref 11.4–15.2)

## 2018-05-20 LAB — URINALYSIS, ROUTINE W REFLEX MICROSCOPIC
Bilirubin Urine: NEGATIVE
Glucose, UA: NEGATIVE mg/dL
Ketones, ur: 15 mg/dL — AB
Leukocytes,Ua: NEGATIVE
Nitrite: NEGATIVE
Protein, ur: NEGATIVE mg/dL
Specific Gravity, Urine: 1.025 (ref 1.005–1.030)
pH: 5 (ref 5.0–8.0)

## 2018-05-20 LAB — URINALYSIS, MICROSCOPIC (REFLEX)

## 2018-05-20 LAB — PREPARE RBC (CROSSMATCH)

## 2018-05-20 LAB — ABO/RH: ABO/RH(D): A POS

## 2018-05-20 NOTE — Progress Notes (Addendum)
PCP - Dr. Sherwood Gambler  Cardiologist - Denies  Chest x-ray - Denies  EKG - Denies  Stress Test - 05/12/2018 (E)  ECHO - Denies  Cardiac Cath - Denies  AICD-na PM-na LOOP-na  Sleep Study - Denies CPAP - Denies  LABS- 05/20/2018: CBC, CMP, PT, PTT, T/S, UA, PCR [place]- 5/18  ASA- Continue [place]-Continue  ERAS- No  Anesthesia- No  Pt denies having chest pain, sob, or fever at this time. All instructions explained to the pt, with a verbal understanding of the material. Pt agrees to go over the instructions while at home for a better understanding. The opportunity to ask questions was provided.   Coronavirus Screening  Have you experienced the following symptoms:  Cough yes/no: No Fever (>100.97F)  yes/no: No Runny nose yes/no: No Sore throat yes/no: No Difficulty breathing/shortness of breath  yes/no: No  Have you or a family member traveled in the last 14 days and where? yes/no: No   If the patient indicates "YES" to the above questions, their PAT will be rescheduled to limit the exposure to others and, the surgeon will be notified. THE PATIENT WILL NEED TO BE ASYMPTOMATIC FOR 14 DAYS.   If the patient is not experiencing any of these symptoms, the PAT nurse will instruct them to NOT bring anyone with them to their appointment since they may have these symptoms or traveled as well.   Please remind your patients and families that hospital visitation restrictions are in effect and the importance of the restrictions.

## 2018-05-20 NOTE — Progress Notes (Signed)
Britnie Colville Preast            05/19/2018                          [facility] [place], [place] - [address] [place] [address] Phone: [phone] Fax: [phone]  [facility] [phone] - [place], [place] - [address] AT [facility] [address] [place] [address] Phone: [phone] Fax: [phone]                         Your procedure is scheduled on May 20th.            Report to [facility] Entrance "A" Admitting at 6:30 A.M.            Call this number if you have problems the morning of surgery:            [phone]             Remember:            Do not eat or drink after midnight.                                   Take these medicines the morning of surgery with A SIP OF WATER: Aspirin EC  Citalopram (Celexa) Cetirizine (ZYRTEC) Pentoxifylline (TRENTAL)    If needed: TraMADol (ULTRAM)     As of today, taking all other Aspirin Products, Aleve, Naproxen, Ibuprofen, Motrin, Advil, Goody's, BC's, all herbal medications, fish oil, and all vitamins. Including: Meloxicam (MOBIC)                         Do not wear jewelry, make-up or nail polish.            Do not wear lotions, powders, or perfumes, or deodorant.            Do not shave 48 hours prior to surgery.              Do not bring valuables to the hospital.            [facility] is not responsible for any belongings or valuables.   [place]- Preparing For Surgery  Before surgery, you can play an important role. Because skin is not sterile, your skin needs to be as free of germs as possible. You can reduce the number of germs on your skin by washing with CHG (chlorahexidine gluconate) Soap before surgery.  CHG is an antiseptic cleaner which kills germs and bonds with the skin to continue killing germs even after washing.    Oral Hygiene is also important to reduce your risk of infection.  Remember - BRUSH YOUR TEETH THE MORNING OF  SURGERY WITH YOUR REGULAR TOOTHPASTE  Please do not use if you have an allergy to CHG or antibacterial soaps. If your skin becomes reddened/irritated stop using the CHG.  Do not shave (including legs and underarms) for at least 48 hours prior to first CHG shower. It is OK to shave your face.  Please follow these instructions carefully.  1. Shower the NIGHT BEFORE SURGERY and the MORNING OF SURGERY with CHG.   2. If you chose to wash your hair, wash your hair first as usual with your normal shampoo.  3. After you shampoo, rinse your hair and body thoroughly to remove the shampoo.  4. Use CHG as you would any other liquid soap. You can apply CHG directly to the skin and wash gently with a scrungie or a clean washcloth.   5. Apply the CHG Soap to your body ONLY FROM THE NECK DOWN.  Do not use on open wounds or open sores. Avoid contact with your eyes, ears, mouth and genitals (private parts). Wash Face and genitals (private parts)  with your normal soap.  6. Wash thoroughly, paying special attention to the area where your surgery will be performed.  7. Thoroughly rinse your body with warm water from the neck down.  8. DO NOT shower/wash with your normal soap after using and rinsing off the CHG Soap.  9. Pat yourself dry with a CLEAN TOWEL.  10. Wear CLEAN PAJAMAS to bed the night before surgery, wear comfortable clothes the morning of surgery  11. Place CLEAN SHEETS on your bed the night of your first shower and DO NOT SLEEP WITH PETS.   Day of Surgery:  Do not apply any deodorants/lotions.  Please wear clean clothes to the hospital/surgery center.   Remember to brush your teeth WITH YOUR REGULAR TOOTHPASTE.   Contacts, dentures or bridgework may not be worn into surgery.  Leave your suitcase in the car.  After surgery it may be brought to your  room.  For patients admitted to the hospital, discharge time will be determined by your treatment team.  Patients discharged the day of surgery will not be allowed to drive home.   Please read over the following fact sheets that you were given. Coughing and Deep Breathing and Surgical Site Infection Prevention

## 2018-05-21 ENCOUNTER — Telehealth: Payer: Self-pay

## 2018-05-21 ENCOUNTER — Other Ambulatory Visit: Payer: 59

## 2018-05-21 ENCOUNTER — Other Ambulatory Visit: Payer: Self-pay

## 2018-05-21 DIAGNOSIS — I872 Venous insufficiency (chronic) (peripheral): Secondary | ICD-10-CM

## 2018-05-21 MED ORDER — TRAMADOL HCL 50 MG PO TABS: 50.0000 mg | ORAL_TABLET | Freq: Four times a day (QID) | ORAL | 0 refills | Status: DC | PRN

## 2018-05-21 NOTE — Telephone Encounter (Signed)
Pt called and said that she would like to get a refill on her Tramadol to last until her surgery on the 20th. She said that she is taking 2-3 a day right now. Also wanted to know if she could put aloe or lidocaine cream on her skin that is cracking.   Spoke with Dr Darrick Penna and refill approved for Tramadol, she will pick it up later today. Also advised her per him to use a moisturizing cream without perfume to the area thinly.   Julious Payer, CMA

## 2018-05-25 ENCOUNTER — Other Ambulatory Visit (HOSPITAL_COMMUNITY)
Admission: RE | Admit: 2018-05-25 | Discharge: 2018-05-25 | Disposition: A | Discharge status: Home / Self Care | Payer: 59 | Source: Ambulatory Visit | Attending: Vascular Surgery | Admitting: Vascular Surgery

## 2018-05-25 DIAGNOSIS — Z01812 Encounter for preprocedural laboratory examination: Secondary | ICD-10-CM | POA: Diagnosis not present

## 2018-05-25 DIAGNOSIS — Z1159 Encounter for screening for other viral diseases: Secondary | ICD-10-CM | POA: Diagnosis not present

## 2018-05-26 LAB — NOVEL CORONAVIRUS, NAA (HOSP ORDER, SEND-OUT TO REF LAB; TAT 18-24 HRS): SARS-CoV-2, NAA: NOT DETECTED

## 2018-05-27 ENCOUNTER — Inpatient Hospital Stay (HOSPITAL_COMMUNITY): Payer: 59

## 2018-05-27 ENCOUNTER — Encounter (HOSPITAL_COMMUNITY): Payer: Self-pay | Admitting: *Deleted

## 2018-05-27 ENCOUNTER — Inpatient Hospital Stay (HOSPITAL_COMMUNITY): Payer: 59 | Admitting: Certified Registered Nurse Anesthetist

## 2018-05-27 ENCOUNTER — Inpatient Hospital Stay (HOSPITAL_COMMUNITY): Payer: 59 | Admitting: Physician Assistant

## 2018-05-27 ENCOUNTER — Inpatient Hospital Stay (HOSPITAL_COMMUNITY)
Admission: RE | Admit: 2018-05-27 | Discharge: 2018-06-01 | DRG: 271 | Disposition: A | Discharge status: Home Health | Payer: 59 | Attending: Vascular Surgery | Admitting: Vascular Surgery

## 2018-05-27 ENCOUNTER — Other Ambulatory Visit: Payer: Self-pay

## 2018-05-27 ENCOUNTER — Encounter (HOSPITAL_COMMUNITY)
Admission: RE | Disposition: A | Discharge status: Home Health | Payer: Self-pay | Source: Home / Self Care | Attending: Vascular Surgery

## 2018-05-27 DIAGNOSIS — Z79899 Other long term (current) drug therapy: Secondary | ICD-10-CM | POA: Diagnosis not present

## 2018-05-27 DIAGNOSIS — I739 Peripheral vascular disease, unspecified: Principal | ICD-10-CM | POA: Diagnosis present

## 2018-05-27 DIAGNOSIS — Z95828 Presence of other vascular implants and grafts: Secondary | ICD-10-CM

## 2018-05-27 DIAGNOSIS — D62 Acute posthemorrhagic anemia: Secondary | ICD-10-CM | POA: Diagnosis not present

## 2018-05-27 DIAGNOSIS — Z7982 Long term (current) use of aspirin: Secondary | ICD-10-CM

## 2018-05-27 DIAGNOSIS — F17210 Nicotine dependence, cigarettes, uncomplicated: Secondary | ICD-10-CM | POA: Diagnosis present

## 2018-05-27 DIAGNOSIS — I96 Gangrene, not elsewhere classified: Secondary | ICD-10-CM

## 2018-05-27 DIAGNOSIS — I7409 Other arterial embolism and thrombosis of abdominal aorta: Secondary | ICD-10-CM | POA: Diagnosis present

## 2018-05-27 HISTORY — PX: AORTA - BILATERAL FEMORAL ARTERY BYPASS GRAFT: SHX1175

## 2018-05-27 LAB — CBC
HCT: 35 % — ABNORMAL LOW (ref 36.0–46.0)
Hemoglobin: 11.7 g/dL — ABNORMAL LOW (ref 12.0–15.0)
MCH: 33 pg (ref 26.0–34.0)
MCHC: 33.4 g/dL (ref 30.0–36.0)
MCV: 98.6 fL (ref 80.0–100.0)
Platelets: 208 10*3/uL (ref 150–400)
RBC: 3.55 MIL/uL — ABNORMAL LOW (ref 3.87–5.11)
RDW: 13.4 % (ref 11.5–15.5)
WBC: 19.2 10*3/uL — ABNORMAL HIGH (ref 4.0–10.5)
nRBC: 0 % (ref 0.0–0.2)

## 2018-05-27 LAB — POCT I-STAT 7, (LYTES, BLD GAS, ICA,H+H)
Acid-base deficit: 1 mmol/L (ref 0.0–2.0)
Acid-base deficit: 2 mmol/L (ref 0.0–2.0)
Bicarbonate: 23.7 mmol/L (ref 20.0–28.0)
Bicarbonate: 23.3 mmol/L (ref 20.0–28.0)
Calcium, Ion: 1.16 mmol/L (ref 1.15–1.40)
Calcium, Ion: 1.13 mmol/L — ABNORMAL LOW (ref 1.15–1.40)
HCT: 36 % (ref 36.0–46.0)
HCT: 33 % — ABNORMAL LOW (ref 36.0–46.0)
Hemoglobin: 12.2 g/dL (ref 12.0–15.0)
Hemoglobin: 11.2 g/dL — ABNORMAL LOW (ref 12.0–15.0)
O2 Saturation: 100 %
O2 Saturation: 100 %
Patient temperature: 36
Patient temperature: 35.8
Potassium: 3.8 mmol/L (ref 3.5–5.1)
Potassium: 3.7 mmol/L (ref 3.5–5.1)
Sodium: 138 mmol/L (ref 135–145)
Sodium: 137 mmol/L (ref 135–145)
TCO2: 25 mmol/L (ref 22–32)
TCO2: 24 mmol/L (ref 22–32)
pCO2 arterial: 35.7 mmHg (ref 32.0–48.0)
pCO2 arterial: 36.9 mmHg (ref 32.0–48.0)
pH, Arterial: 7.426 (ref 7.350–7.450)
pH, Arterial: 7.404 (ref 7.350–7.450)
pO2, Arterial: 277 mmHg — ABNORMAL HIGH (ref 83.0–108.0)
pO2, Arterial: 194 mmHg — ABNORMAL HIGH (ref 83.0–108.0)

## 2018-05-27 LAB — BASIC METABOLIC PANEL
Anion gap: 10 (ref 5–15)
BUN: 5 mg/dL — ABNORMAL LOW (ref 6–20)
CO2: 20 mmol/L — ABNORMAL LOW (ref 22–32)
Calcium: 8 mg/dL — ABNORMAL LOW (ref 8.9–10.3)
Chloride: 106 mmol/L (ref 98–111)
Creatinine, Ser: 0.86 mg/dL (ref 0.44–1.00)
GFR calc Af Amer: >60 mL/min (ref >60)
GFR calc non Af Amer: >60 mL/min (ref >60)
Glucose, Bld: 209 mg/dL — ABNORMAL HIGH (ref 70–99)
Potassium: 4 mmol/L (ref 3.5–5.1)
Sodium: 136 mmol/L (ref 135–145)

## 2018-05-27 LAB — CREATININE, SERUM
Creatinine, Ser: 0.86 mg/dL (ref 0.44–1.00)
GFR calc Af Amer: >60 mL/min (ref >60)
GFR calc non Af Amer: >60 mL/min (ref >60)

## 2018-05-27 LAB — MAGNESIUM: Magnesium: 1.5 mg/dL — ABNORMAL LOW (ref 1.7–2.4)

## 2018-05-27 LAB — BLOOD GAS, ARTERIAL
Acid-base deficit: 3.4 mmol/L — ABNORMAL HIGH (ref 0.0–2.0)
Bicarbonate: 22 mmol/L (ref 20.0–28.0)
Drawn by: 41680
O2 Content: 4 L/min
O2 Saturation: 96.9 %
Patient temperature: 98.6
pCO2 arterial: 45.9 mmHg (ref 32.0–48.0)
pH, Arterial: 7.302 — ABNORMAL LOW (ref 7.350–7.450)
pO2, Arterial: 103 mmHg (ref 83.0–108.0)

## 2018-05-27 LAB — PROTIME-INR
INR: 1.2 (ref 0.8–1.2)
Prothrombin Time: 15.1 seconds (ref 11.4–15.2)

## 2018-05-27 LAB — APTT: aPTT: 26 seconds (ref 24–36)

## 2018-05-27 LAB — MRSA PCR SCREENING: MRSA by PCR: NEGATIVE

## 2018-05-27 SURGERY — CREATION, BYPASS, ARTERIAL, AORTA TO FEMORAL, BILATERAL, USING GRAFT
Anesthesia: General | Site: Abdomen | Laterality: Bilateral

## 2018-05-27 MED ORDER — CEFAZOLIN SODIUM-DEXTROSE 2-4 GM/100ML-% IV SOLN
2.0000 g | Freq: Three times a day (TID) | INTRAVENOUS | Status: AC
  Administered 2018-05-27 (×2): 2 g via INTRAVENOUS
  Filled 2018-05-27 (×2): qty 100

## 2018-05-27 MED ORDER — HYDROMORPHONE HCL 1 MG/ML IJ SOLN
1.0000 mg | INTRAMUSCULAR | Status: DC | PRN
  Administered 2018-05-28 (×2): 1 mg via INTRAVENOUS
  Administered 2018-05-28: 2 mg via INTRAVENOUS
  Administered 2018-05-28: 1 mg via INTRAVENOUS
  Administered 2018-05-28: 2 mg via INTRAVENOUS
  Administered 2018-05-28 (×2): 1 mg via INTRAVENOUS
  Administered 2018-05-28: 2 mg via INTRAVENOUS
  Administered 2018-05-28: 1 mg via INTRAVENOUS
  Administered 2018-05-28: 2 mg via INTRAVENOUS
  Administered 2018-05-28 – 2018-05-29 (×5): 1 mg via INTRAVENOUS
  Administered 2018-05-29 (×2): 2 mg via INTRAVENOUS
  Administered 2018-05-30 – 2018-06-01 (×4): 1 mg via INTRAVENOUS
  Filled 2018-05-27 (×2): qty 1
  Filled 2018-05-27: qty 2
  Filled 2018-05-27 (×3): qty 1
  Filled 2018-05-27: qty 2
  Filled 2018-05-27: qty 1
  Filled 2018-05-27: qty 2
  Filled 2018-05-27: qty 1
  Filled 2018-05-27: qty 2
  Filled 2018-05-27 (×5): qty 1
  Filled 2018-05-27: qty 2
  Filled 2018-05-27 (×2): qty 1
  Filled 2018-05-27: qty 2
  Filled 2018-05-27 (×3): qty 1

## 2018-05-27 MED ORDER — ALBUMIN HUMAN 5 % IV SOLN
INTRAVENOUS | Status: DC | PRN
  Administered 2018-05-27 (×2): via INTRAVENOUS

## 2018-05-27 MED ORDER — CHLORHEXIDINE GLUCONATE 4 % EX LIQD: 60.0000 mL | Freq: Once | CUTANEOUS | Status: DC

## 2018-05-27 MED ORDER — NITROPRUSSIDE SODIUM 25 MG/ML IV SOLN
INTRAVENOUS | Status: DC | PRN
  Administered 2018-05-27: 5 ug/kg/min via INTRAVENOUS

## 2018-05-27 MED ORDER — CEFAZOLIN SODIUM-DEXTROSE 2-3 GM-%(50ML) IV SOLR
INTRAVENOUS | Status: DC | PRN
  Administered 2018-05-27: 2 g via INTRAVENOUS

## 2018-05-27 MED ORDER — SODIUM CHLORIDE 0.9 % IV SOLN
INTRAVENOUS | Status: DC | PRN
  Administered 2018-05-27: 25 ug/min via INTRAVENOUS

## 2018-05-27 MED ORDER — ROCURONIUM BROMIDE 50 MG/5ML IV SOSY
PREFILLED_SYRINGE | INTRAVENOUS | Status: DC | PRN
  Administered 2018-05-27: 30 mg via INTRAVENOUS
  Administered 2018-05-27 (×2): 20 mg via INTRAVENOUS
  Administered 2018-05-27: 50 mg via INTRAVENOUS

## 2018-05-27 MED ORDER — PROPOFOL 10 MG/ML IV BOLUS
INTRAVENOUS | Status: AC
  Filled 2018-05-27: qty 20

## 2018-05-27 MED ORDER — MORPHINE SULFATE (PF) 4 MG/ML IV SOLN
INTRAVENOUS | Status: AC
  Filled 2018-05-27: qty 1

## 2018-05-27 MED ORDER — SODIUM CHLORIDE 0.9 % IV SOLN
INTRAVENOUS | Status: AC
  Filled 2018-05-27: qty 1.2

## 2018-05-27 MED ORDER — LIDOCAINE 2% (20 MG/ML) 5 ML SYRINGE
INTRAMUSCULAR | Status: AC
  Filled 2018-05-27: qty 5

## 2018-05-27 MED ORDER — LABETALOL HCL 5 MG/ML IV SOLN
INTRAVENOUS | Status: DC | PRN
  Administered 2018-05-27 (×3): 5 mg via INTRAVENOUS

## 2018-05-27 MED ORDER — GLYCOPYRROLATE PF 0.2 MG/ML IJ SOSY
PREFILLED_SYRINGE | INTRAMUSCULAR | Status: AC
  Filled 2018-05-27: qty 1

## 2018-05-27 MED ORDER — NEOSTIGMINE METHYLSULFATE 3 MG/3ML IV SOSY
PREFILLED_SYRINGE | INTRAVENOUS | Status: AC
  Filled 2018-05-27: qty 3

## 2018-05-27 MED ORDER — DOCUSATE SODIUM 100 MG PO CAPS
100.0000 mg | ORAL_CAPSULE | Freq: Every day | ORAL | Status: DC
  Administered 2018-05-30 – 2018-05-31 (×2): 100 mg via ORAL
  Filled 2018-05-27 (×3): qty 1

## 2018-05-27 MED ORDER — OXYMETAZOLINE HCL 0.05 % NA SOLN
NASAL | Status: AC
  Filled 2018-05-27: qty 30

## 2018-05-27 MED ORDER — PROTAMINE SULFATE 10 MG/ML IV SOLN
INTRAVENOUS | Status: AC
  Filled 2018-05-27: qty 5

## 2018-05-27 MED ORDER — PROPOFOL 10 MG/ML IV BOLUS
INTRAVENOUS | Status: DC | PRN
  Administered 2018-05-27 (×2): 20 mg via INTRAVENOUS
  Administered 2018-05-27: 140 mg via INTRAVENOUS

## 2018-05-27 MED ORDER — MANNITOL 25 % IV SOLN
INTRAVENOUS | Status: DC | PRN
  Administered 2018-05-27: 12.5 g via INTRAVENOUS

## 2018-05-27 MED ORDER — HYDRALAZINE HCL 20 MG/ML IJ SOLN
10.0000 mg | Freq: Once | INTRAMUSCULAR | Status: AC
  Administered 2018-05-27: 10 mg via INTRAVENOUS

## 2018-05-27 MED ORDER — OXYCODONE HCL 5 MG/5ML PO SOLN: 5.0000 mg | Freq: Once | ORAL | Status: DC | PRN

## 2018-05-27 MED ORDER — OXYMETAZOLINE HCL 0.05 % NA SOLN
NASAL | Status: DC | PRN
  Administered 2018-05-27 (×2): 2 via NASAL

## 2018-05-27 MED ORDER — 0.9 % SODIUM CHLORIDE (POUR BTL) OPTIME
TOPICAL | Status: DC | PRN
  Administered 2018-05-27: 3000 mL

## 2018-05-27 MED ORDER — SUCCINYLCHOLINE CHLORIDE 200 MG/10ML IV SOSY
PREFILLED_SYRINGE | INTRAVENOUS | Status: AC
  Filled 2018-05-27: qty 10

## 2018-05-27 MED ORDER — LIDOCAINE 2% (20 MG/ML) 5 ML SYRINGE
INTRAMUSCULAR | Status: DC | PRN
  Administered 2018-05-27: 40 mg via INTRAVENOUS

## 2018-05-27 MED ORDER — FENTANYL CITRATE (PF) 100 MCG/2ML IJ SOLN
INTRAMUSCULAR | Status: AC
  Filled 2018-05-27: qty 2

## 2018-05-27 MED ORDER — FENTANYL CITRATE (PF) 250 MCG/5ML IJ SOLN
INTRAMUSCULAR | Status: AC
  Filled 2018-05-27: qty 5

## 2018-05-27 MED ORDER — DEXTROSE-NACL 5-0.45 % IV SOLN
INTRAVENOUS | Status: DC
  Administered 2018-05-27 – 2018-05-29 (×8): via INTRAVENOUS

## 2018-05-27 MED ORDER — SODIUM CHLORIDE 0.9 % IV SOLN
500.0000 mL | Freq: Once | INTRAVENOUS | Status: AC | PRN
  Administered 2018-05-28: 500 mL via INTRAVENOUS

## 2018-05-27 MED ORDER — ONDANSETRON HCL 4 MG/2ML IJ SOLN
INTRAMUSCULAR | Status: AC
  Filled 2018-05-27: qty 2

## 2018-05-27 MED ORDER — ONDANSETRON HCL 4 MG/2ML IJ SOLN
INTRAMUSCULAR | Status: DC | PRN
  Administered 2018-05-27: 4 mg via INTRAVENOUS

## 2018-05-27 MED ORDER — PROTAMINE SULFATE 10 MG/ML IV SOLN
INTRAVENOUS | Status: DC | PRN
  Administered 2018-05-27 (×5): 10 mg via INTRAVENOUS

## 2018-05-27 MED ORDER — SODIUM CHLORIDE 0.9 % IV SOLN
INTRAVENOUS | Status: DC | PRN
  Administered 2018-05-27: 500 mL

## 2018-05-27 MED ORDER — MIDAZOLAM HCL 5 MG/5ML IJ SOLN
INTRAMUSCULAR | Status: DC | PRN
  Administered 2018-05-27 (×2): 1 mg via INTRAVENOUS

## 2018-05-27 MED ORDER — FENTANYL CITRATE (PF) 100 MCG/2ML IJ SOLN
25.0000 ug | INTRAMUSCULAR | Status: DC | PRN
  Administered 2018-05-27: 50 ug via INTRAVENOUS

## 2018-05-27 MED ORDER — OXYCODONE HCL 5 MG PO TABS: 5.0000 mg | ORAL_TABLET | Freq: Once | ORAL | Status: DC | PRN

## 2018-05-27 MED ORDER — LIDOCAINE 4 % EX CREA
1.0000 "application " | TOPICAL_CREAM | Freq: Two times a day (BID) | CUTANEOUS | Status: DC | PRN
  Filled 2018-05-27: qty 5

## 2018-05-27 MED ORDER — CEFAZOLIN SODIUM-DEXTROSE 2-4 GM/100ML-% IV SOLN
2.0000 g | INTRAVENOUS | Status: AC
  Administered 2018-05-27: 2 g via INTRAVENOUS
  Filled 2018-05-27: qty 100

## 2018-05-27 MED ORDER — DEXAMETHASONE SODIUM PHOSPHATE 4 MG/ML IJ SOLN
INTRAMUSCULAR | Status: DC | PRN
  Administered 2018-05-27: 5 mg via INTRAVENOUS

## 2018-05-27 MED ORDER — SUGAMMADEX SODIUM 200 MG/2ML IV SOLN
INTRAVENOUS | Status: DC | PRN
  Administered 2018-05-27: 123 mg via INTRAVENOUS

## 2018-05-27 MED ORDER — POTASSIUM CHLORIDE CRYS ER 20 MEQ PO TBCR: 20.0000 meq | EXTENDED_RELEASE_TABLET | Freq: Once | ORAL | Status: DC | PRN

## 2018-05-27 MED ORDER — PHENOL 1.4 % MT LIQD: 1.0000 | OROMUCOSAL | Status: DC | PRN

## 2018-05-27 MED ORDER — HEPARIN SODIUM (PORCINE) 5000 UNIT/ML IJ SOLN
5000.0000 [IU] | Freq: Three times a day (TID) | INTRAMUSCULAR | Status: DC
  Administered 2018-05-28 – 2018-06-01 (×10): 5000 [IU] via SUBCUTANEOUS
  Filled 2018-05-27 (×11): qty 1

## 2018-05-27 MED ORDER — PANTOPRAZOLE SODIUM 40 MG IV SOLR
40.0000 mg | Freq: Every day | INTRAVENOUS | Status: DC
  Administered 2018-05-27 – 2018-05-30 (×4): 40 mg via INTRAVENOUS
  Filled 2018-05-27 (×4): qty 40

## 2018-05-27 MED ORDER — MORPHINE SULFATE (PF) 2 MG/ML IV SOLN
2.0000 mg | INTRAVENOUS | Status: DC | PRN
  Administered 2018-05-27 (×2): 4 mg via INTRAVENOUS
  Administered 2018-05-27: 5 mg via INTRAVENOUS
  Administered 2018-05-27 (×2): 4 mg via INTRAVENOUS
  Administered 2018-05-27: 5 mg via INTRAVENOUS
  Filled 2018-05-27: qty 3
  Filled 2018-05-27 (×2): qty 2
  Filled 2018-05-27: qty 3
  Filled 2018-05-27: qty 2

## 2018-05-27 MED ORDER — ROCURONIUM BROMIDE 10 MG/ML (PF) SYRINGE
PREFILLED_SYRINGE | INTRAVENOUS | Status: AC
  Filled 2018-05-27: qty 10

## 2018-05-27 MED ORDER — EPHEDRINE 5 MG/ML INJ
INTRAVENOUS | Status: AC
  Filled 2018-05-27: qty 10

## 2018-05-27 MED ORDER — MIDAZOLAM HCL 2 MG/2ML IJ SOLN
INTRAMUSCULAR | Status: AC
  Filled 2018-05-27: qty 2

## 2018-05-27 MED ORDER — METOPROLOL TARTRATE 5 MG/5ML IV SOLN
2.5000 mg | Freq: Four times a day (QID) | INTRAVENOUS | Status: DC
  Administered 2018-05-27 – 2018-06-01 (×16): 2.5 mg via INTRAVENOUS
  Filled 2018-05-27 (×16): qty 5

## 2018-05-27 MED ORDER — ONDANSETRON HCL 4 MG/2ML IJ SOLN
4.0000 mg | Freq: Four times a day (QID) | INTRAMUSCULAR | Status: DC | PRN
  Administered 2018-05-29 – 2018-05-31 (×2): 4 mg via INTRAVENOUS
  Filled 2018-05-27 (×2): qty 2

## 2018-05-27 MED ORDER — CEFAZOLIN SODIUM 1 G IJ SOLR
INTRAMUSCULAR | Status: AC
  Filled 2018-05-27: qty 20

## 2018-05-27 MED ORDER — SODIUM CHLORIDE (PF) 0.9 % IJ SOLN
INTRAMUSCULAR | Status: AC
  Filled 2018-05-27: qty 10

## 2018-05-27 MED ORDER — LABETALOL HCL 5 MG/ML IV SOLN: 10.0000 mg | INTRAVENOUS | Status: DC | PRN

## 2018-05-27 MED ORDER — ACETAMINOPHEN 325 MG PO TABS
325.0000 mg | ORAL_TABLET | ORAL | Status: DC | PRN
  Administered 2018-05-29 – 2018-06-01 (×4): 650 mg via ORAL
  Filled 2018-05-27 (×4): qty 2

## 2018-05-27 MED ORDER — PANTOPRAZOLE SODIUM 40 MG PO TBEC
40.0000 mg | DELAYED_RELEASE_TABLET | Freq: Every day | ORAL | Status: DC
  Administered 2018-05-30 – 2018-06-01 (×3): 40 mg via ORAL
  Filled 2018-05-27 (×3): qty 1

## 2018-05-27 MED ORDER — HEPARIN SODIUM (PORCINE) 1000 UNIT/ML IJ SOLN
INTRAMUSCULAR | Status: AC
  Filled 2018-05-27: qty 2

## 2018-05-27 MED ORDER — PHENYLEPHRINE 40 MCG/ML (10ML) SYRINGE FOR IV PUSH (FOR BLOOD PRESSURE SUPPORT)
PREFILLED_SYRINGE | INTRAVENOUS | Status: DC | PRN
  Administered 2018-05-27 (×2): 80 ug via INTRAVENOUS

## 2018-05-27 MED ORDER — HYDRALAZINE HCL 20 MG/ML IJ SOLN
INTRAMUSCULAR | Status: AC
  Filled 2018-05-27: qty 1

## 2018-05-27 MED ORDER — HEPARIN SODIUM (PORCINE) 1000 UNIT/ML IJ SOLN
INTRAMUSCULAR | Status: DC | PRN
  Administered 2018-05-27: 7000 [IU] via INTRAVENOUS
  Administered 2018-05-27: 5000 [IU] via INTRAVENOUS

## 2018-05-27 MED ORDER — HYDRALAZINE HCL 20 MG/ML IJ SOLN
5.0000 mg | INTRAMUSCULAR | Status: DC | PRN
  Administered 2018-05-27: 5 mg via INTRAVENOUS
  Filled 2018-05-27: qty 1

## 2018-05-27 MED ORDER — ONDANSETRON HCL 4 MG/2ML IJ SOLN: 4.0000 mg | Freq: Once | INTRAMUSCULAR | Status: DC | PRN

## 2018-05-27 MED ORDER — PHENYLEPHRINE 40 MCG/ML (10ML) SYRINGE FOR IV PUSH (FOR BLOOD PRESSURE SUPPORT)
PREFILLED_SYRINGE | INTRAVENOUS | Status: AC
  Filled 2018-05-27: qty 10

## 2018-05-27 MED ORDER — METOPROLOL TARTRATE 5 MG/5ML IV SOLN: 2.0000 mg | INTRAVENOUS | Status: DC | PRN

## 2018-05-27 MED ORDER — ALUM & MAG HYDROXIDE-SIMETH 200-200-20 MG/5ML PO SUSP
15.0000 mL | ORAL | Status: DC | PRN
  Administered 2018-06-01: 30 mL via ORAL
  Filled 2018-05-27: qty 30

## 2018-05-27 MED ORDER — PHENYLEPHRINE HCL (PRESSORS) 10 MG/ML IV SOLN: INTRAVENOUS | Status: DC | PRN

## 2018-05-27 MED ORDER — DEXAMETHASONE SODIUM PHOSPHATE 10 MG/ML IJ SOLN
INTRAMUSCULAR | Status: AC
  Filled 2018-05-27: qty 1

## 2018-05-27 MED ORDER — SODIUM CHLORIDE 0.9 % IV SOLN
INTRAVENOUS | Status: DC
  Administered 2018-05-27 (×2): via INTRAVENOUS

## 2018-05-27 MED ORDER — ACETAMINOPHEN 325 MG RE SUPP: 325.0000 mg | RECTAL | Status: DC | PRN

## 2018-05-27 MED ORDER — ARTIFICIAL TEARS OPHTHALMIC OINT
TOPICAL_OINTMENT | OPHTHALMIC | Status: AC
  Filled 2018-05-27: qty 3.5

## 2018-05-27 MED ORDER — BISACODYL 10 MG RE SUPP: 10.0000 mg | Freq: Every day | RECTAL | Status: DC | PRN

## 2018-05-27 MED ORDER — LACTATED RINGERS IV SOLN
INTRAVENOUS | Status: DC | PRN
  Administered 2018-05-27 (×2): via INTRAVENOUS

## 2018-05-27 MED ORDER — GUAIFENESIN-DM 100-10 MG/5ML PO SYRP: 15.0000 mL | ORAL_SOLUTION | ORAL | Status: DC | PRN

## 2018-05-27 MED ORDER — MAGNESIUM SULFATE 2 GM/50ML IV SOLN
2.0000 g | Freq: Once | INTRAVENOUS | Status: AC | PRN
  Administered 2018-05-27: 2 g via INTRAVENOUS
  Filled 2018-05-27: qty 50

## 2018-05-27 MED ORDER — LACTATED RINGERS IV SOLN
INTRAVENOUS | Status: DC | PRN
  Administered 2018-05-27 (×2): via INTRAVENOUS

## 2018-05-27 MED ORDER — SUCCINYLCHOLINE CHLORIDE 200 MG/10ML IV SOSY
PREFILLED_SYRINGE | INTRAVENOUS | Status: DC | PRN
  Administered 2018-05-27: 140 mg via INTRAVENOUS

## 2018-05-27 MED ORDER — FENTANYL CITRATE (PF) 250 MCG/5ML IJ SOLN
INTRAMUSCULAR | Status: DC | PRN
  Administered 2018-05-27 (×10): 50 ug via INTRAVENOUS

## 2018-05-27 MED ORDER — POVIDONE-IODINE 10 % EX OINT
1.0000 "application " | TOPICAL_OINTMENT | CUTANEOUS | Status: DC | PRN
  Filled 2018-05-27: qty 28.35

## 2018-05-27 SURGICAL SUPPLY — 67 items
ADH SKN CLS APL DERMABOND .7 (GAUZE/BANDAGES/DRESSINGS) ×2
AGENT HMST SPONGE THK3/8 (HEMOSTASIS)
ATTRACTOMAT 16X20 MAGNETIC DRP (DRAPES) ×2 IMPLANT
BAG ISL DRAPE 18X18 STRL (DRAPES) ×2
BAG ISOLATION DRAPE 18X18 (DRAPES) ×2 IMPLANT
CANISTER SUCT 3000ML PPV (MISCELLANEOUS) ×2 IMPLANT
CANNULA VESSEL 3MM 2 BLNT TIP (CANNULA) ×4 IMPLANT
CLIP VESOCCLUDE MED 24/CT (CLIP) ×2 IMPLANT
CLIP VESOCCLUDE SM WIDE 24/CT (CLIP) ×2 IMPLANT
COVER WAND RF STERILE (DRAPES) ×2 IMPLANT
DERMABOND ADVANCED (GAUZE/BANDAGES/DRESSINGS) ×2
DERMABOND ADVANCED .7 DNX12 (GAUZE/BANDAGES/DRESSINGS) IMPLANT
DRAPE ISOLATION BAG 18X18 (DRAPES) ×2
DRSG COVADERM 4X14 (GAUZE/BANDAGES/DRESSINGS) ×1 IMPLANT
ELECT BLADE 6.5 EXT (BLADE) ×1 IMPLANT
ELECT REM PT RETURN 9FT ADLT (ELECTROSURGICAL) ×2
ELECTRODE REM PT RTRN 9FT ADLT (ELECTROSURGICAL) ×1 IMPLANT
GLOVE BIO SURGEON STRL SZ 6.5 (GLOVE) ×2 IMPLANT
GLOVE BIO SURGEON STRL SZ7.5 (GLOVE) ×2 IMPLANT
GLOVE BIO SURGEON STRL SZ8 (GLOVE) ×1 IMPLANT
GLOVE BIOGEL PI IND STRL 7.0 (GLOVE) IMPLANT
GLOVE BIOGEL PI IND STRL 8 (GLOVE) IMPLANT
GLOVE BIOGEL PI INDICATOR 7.0 (GLOVE) ×3
GLOVE BIOGEL PI INDICATOR 8 (GLOVE) ×1
GOWN STRL REUS W/ TWL LRG LVL3 (GOWN DISPOSABLE) ×3 IMPLANT
GOWN STRL REUS W/TWL LRG LVL3 (GOWN DISPOSABLE) ×6
GRAFT HEMASHIELD 14X7MM (Vascular Products) ×1 IMPLANT
HEMOSTAT SPONGE AVITENE ULTRA (HEMOSTASIS) IMPLANT
INSERT FOGARTY 61MM (MISCELLANEOUS) ×1 IMPLANT
INSERT FOGARTY SM (MISCELLANEOUS) ×2 IMPLANT
KIT BASIN OR (CUSTOM PROCEDURE TRAY) ×2 IMPLANT
KIT TURNOVER KIT B (KITS) ×2 IMPLANT
LOOP VESSEL MINI RED (MISCELLANEOUS) ×2 IMPLANT
NS IRRIG 1000ML POUR BTL (IV SOLUTION) ×4 IMPLANT
PACK AORTA (CUSTOM PROCEDURE TRAY) ×2 IMPLANT
PAD ARMBOARD 7.5X6 YLW CONV (MISCELLANEOUS) ×4 IMPLANT
RETAINER VISCERA MED (MISCELLANEOUS) ×2 IMPLANT
SPONGE LAP 18X18 RF (DISPOSABLE) IMPLANT
STAPLER VISISTAT 35W (STAPLE) ×4 IMPLANT
SUT PDS AB 1 TP1 54 (SUTURE) ×4 IMPLANT
SUT PROLENE 3 0 SH DA (SUTURE) ×9 IMPLANT
SUT PROLENE 3 0 SH1 36 (SUTURE) IMPLANT
SUT PROLENE 5 0 C 1 24 (SUTURE) ×4 IMPLANT
SUT PROLENE 5 0 C 1 36 (SUTURE) ×4 IMPLANT
SUT PROLENE 6 0 C 1 30 (SUTURE) ×2 IMPLANT
SUT PROLENE 6 0 CC (SUTURE) ×2 IMPLANT
SUT SILK 2 0 (SUTURE) ×2
SUT SILK 2 0 TIES 17X18 (SUTURE) ×2
SUT SILK 2 0SH CR/8 30 (SUTURE) ×2 IMPLANT
SUT SILK 2-0 18XBRD TIE 12 (SUTURE) ×1 IMPLANT
SUT SILK 2-0 18XBRD TIE BLK (SUTURE) ×1 IMPLANT
SUT SILK 3 0 (SUTURE) ×2
SUT SILK 3 0 TIES 17X18 (SUTURE) ×2
SUT SILK 3-0 18XBRD TIE 12 (SUTURE) ×1 IMPLANT
SUT SILK 3-0 18XBRD TIE BLK (SUTURE) ×1 IMPLANT
SUT VIC AB 2-0 CT1 27 (SUTURE) ×2
SUT VIC AB 2-0 CT1 TAPERPNT 27 (SUTURE) IMPLANT
SUT VIC AB 2-0 SH 27 (SUTURE) ×6
SUT VIC AB 2-0 SH 27XBRD (SUTURE) ×3 IMPLANT
SUT VIC AB 3-0 SH 27 (SUTURE) ×12
SUT VIC AB 3-0 SH 27X BRD (SUTURE) ×3 IMPLANT
SUT VIC AB 4-0 PS2 18 (SUTURE) ×4 IMPLANT
SUT VICRYL 4-0 PS2 18IN ABS (SUTURE) IMPLANT
TAPE UMBILICAL COTTON 1/8X30 (MISCELLANEOUS) ×6 IMPLANT
TOWEL GREEN STERILE (TOWEL DISPOSABLE) ×2 IMPLANT
TRAY FOLEY MTR SLVR 16FR STAT (SET/KITS/TRAYS/PACK) ×2 IMPLANT
WATER STERILE IRR 1000ML POUR (IV SOLUTION) ×4 IMPLANT

## 2018-05-27 NOTE — Anesthesia Procedure Notes (Signed)
Performed by: Courtland Reas [place], CRNA       

## 2018-05-27 NOTE — Progress Notes (Signed)
6 mg of morphine pulled from pyxis.  1 mg wasted with Sheila Oats, RN but did not appear that pyxis registered waste therefore wasted 1 mg again in pyxis and then pyxis registered that 2 mg had been wasted.  Called pharmacy and they instructed RN to make note.  5 mg of IV morphine given to patient and only 1 mg was actually wasted.

## 2018-05-27 NOTE — Progress Notes (Signed)
Pt sleepy but awake in PACU, No groin hematoma 2+ DP pulses bilaterally  Stable in PACU  Ex sister in law updated by phone  Fabienne Bruns, MD [facility] Office: [phone] Pager: [phone]

## 2018-05-27 NOTE — Anesthesia Procedure Notes (Signed)
Procedure Name: Intubation Date/Time: 05/27/2018 8:36 AM Performed by: Glynda Jaeger, CRNA Pre-anesthesia Checklist: Patient identified, Patient being monitored, Timeout performed, Emergency Drugs available and Suction available Patient Re-evaluated:Patient Re-evaluated prior to induction Oxygen Delivery Method: Circle System Utilized Preoxygenation: Pre-oxygenation with 100% oxygen Induction Type: IV induction Laryngoscope Size: Mac and 3 Grade View: Grade II Tube type: Oral Tube size: 8.0 mm Number of attempts: 1 Airway Equipment and Method: Stylet Placement Confirmation: ETT inserted through vocal cords under direct vision,  positive ETCO2 and breath sounds checked- equal and bilateral Secured at: 19 cm Tube secured with: Tape Dental Injury: Teeth and Oropharynx as per pre-operative assessment  Comments: RSI

## 2018-05-27 NOTE — Progress Notes (Signed)
  Day of Surgery Note    Subjective:  Resting comfortably in pacu   Vitals:   05/27/18 1447 05/27/18 1451  BP: (!) 153/71 135/79  Pulse:  75  Resp:  14  Temp:    SpO2:  97%    Incisions:   Midline incision is clean and dry; bilateral groin incisions clean and dry without hematoma Extremities:  + doppler signals bilateral DP/PT Cardiac:  regular Lungs:  Non labored Abdomen:  Mildly distended   Assessment/Plan:  This is a 58 y.o. female who is s/p  Aortobifemoral bypass grafting   -pt with + doppler signals bilateral DP/PT -incisions look fine -abdomen mildly distended-unchanged from OR -hemodynamically stable -continue npo -to ICU later today   Doreatha Massed, PA-C 05/27/2018 3:06 PM [phone]

## 2018-05-27 NOTE — Anesthesia Procedure Notes (Signed)
Central Venous Catheter Insertion Performed by: Annye Asa, MD, anesthesiologist Start/End5/20/2020 7:45 AM, 05/27/2018 8:02 AM Patient location: Pre-op. Preanesthetic checklist: patient identified, IV checked, risks and benefits discussed, surgical consent, monitors and equipment checked, pre-op evaluation, timeout performed and anesthesia consent Position: Trendelenburg Lidocaine 1% used for infiltration and patient sedated Hand hygiene performed , maximum sterile barriers used  and Seldinger technique used Catheter size: 8 Fr Total catheter length 16. Central line was placed.Double lumen Procedure performed using ultrasound guided (with unsuccessful RIJ attempt) technique. Ultrasound Notes:anatomy identified, needle tip was noted to be adjacent to the nerve/plexus identified and image(s) printed for medical record Attempts: 1 Following insertion, line sutured, dressing applied and Biopatch. Post procedure assessment: blood return through all ports, free fluid flow and no air  Patient tolerated the procedure well with no immediate complications. Additional procedure comments: CVP: Timeout, sterile prep, drape, FBP R neck.  Trendelenburg position.  1% lido local, finder and trocar RIJ 1st pass with US guidance. Unable to pass guidewire.  Re-prep, drape L clavicle, 1% lido local and trocar SCV 1st pass, 2 lumen placed over J wire. Biopatch and sterile dressing on.  Patient tolerated well.  VSS.  Jenita Seashore, MD.

## 2018-05-27 NOTE — Anesthesia Preprocedure Evaluation (Addendum)
Anesthesia Evaluation  Patient identified by MRN, date of birth, ID band Patient awake    Reviewed: Allergy & Precautions, NPO status , Patient's Chart, lab work & pertinent test results  Airway Mallampati: II  TM Distance: >3 FB Neck ROM: Full    Dental  (+) Teeth Intact, Dental Advisory Given   Pulmonary Current Smoker,    breath sounds clear to auscultation       Cardiovascular  Rhythm:Regular Rate:Normal     Neuro/Psych    GI/Hepatic   Endo/Other    Renal/GU      Musculoskeletal   Abdominal   Peds  Hematology   Anesthesia Other Findings   Reproductive/Obstetrics                             Anesthesia Physical Anesthesia Plan  ASA: III  Anesthesia Plan: General   Post-op Pain Management:    Induction: Intravenous  PONV Risk Score and Plan: Ondansetron and Dexamethasone  Airway Management Planned: Oral ETT  Additional Equipment:   Intra-op Plan:   Post-operative Plan: Extubation in OR  Informed Consent: I have reviewed the patients History and Physical, chart, labs and discussed the procedure including the risks, benefits and alternatives for the proposed anesthesia with the patient or authorized representative who has indicated his/her understanding and acceptance.   Dental advisory given  Plan Discussed with: CRNA and Anesthesiologist  Anesthesia Plan Comments:         Anesthesia Quick Evaluation  

## 2018-05-27 NOTE — Anesthesia Procedure Notes (Signed)
Arterial Line Insertion Start/End5/20/2020 6:40 AM, 05/27/2018 6:45 AM Performed by: Margarita Rana, CRNA, CRNA  Preanesthetic checklist: patient identified, IV checked, site marked, risks and benefits discussed, surgical consent, monitors and equipment checked, pre-op evaluation, timeout performed and anesthesia consent Lidocaine 1% used for infiltration and patient sedated Right, radial was placed Catheter size: 20 G Hand hygiene performed  and maximum sterile barriers used  Allen's test indicative of satisfactory collateral circulation Attempts: 1 Procedure performed without using ultrasound guided technique. Following insertion, dressing applied and Biopatch. Post procedure assessment: normal  Patient tolerated the procedure well with no immediate complications.

## 2018-05-27 NOTE — Op Note (Signed)
Procedure: Aortobifemoral bypass, ligation left renal vein, suprarenal clamp  Preoperative diagnosis: Gangrene right fifth toe  Postoperative diagnosis: Same  Anesthesia: General  Assistant: Doreatha Massed, PA-C  Operative findings: 14 x 7 mm Dacron graft end-to-end infrarenal abdominal aorta  Clamp above right below left renal  Operative details: After team informed consent, the patient was taken the operating.  The patient was placed in supine position operating table.  After induction of general anesthesia endotracheal ovation, and nasogastric tube and Foley catheter placed.  Next a longitudinal incision was made in the right groin carried out through subcutaneous tissues down the level right common femoral artery.  This was fairly small in diameter about 4 mm.  The superficial femoral profunda femoris arteries were dissected free circumferentially and Vesseloops placed around these.  She had a fairly short common femoral bilaterally.  A similar incision was placed in the left groin carried down through subcutaneous tissues down level left common femoral artery.  The profunda takeoff was slightly higher on the left than on the right.  Profundofemoral superficial femoral and common femoral arteries were dissected free circumferentially.  I then began retroperitoneal tunnels on both sides up above the inguinal ligament.  Next attention was turned to the abdomen.  A midline laparotomy incision was made extending from the xiphoid to the pubis.  The incision was carried through to see the through the subcutaneous tissues down level of fascia.  The fascia and peritoneum were incised for the full length the incision.  The abdomen was entered.  Small bowel: Liver all appeared fairly normal in appearance.  NG was palpated within the stomach.  Next the small bowel was reflected to the right transverse colon and omentum reflected superiorly.  The retroperitoneum was entered.  Abdominal aorta was dissected  free on its anterior surface from the bifurcation all the way to the level of the left renal vein.  Aorta overall was fairly small in diameter.  Inferior mesenteric artery was identified and a vessel loop placed around this.  The inferior mesenteric vein was ligated and divided tween silk ties.  Retroperitoneal tunnels were completed on the left and the right retro-ureteral retrocolic and an umbilical plate placed between these. Omni retractor was used for assistance in retraction.  Preoperative CT scan had shown thrombus adjacent to the renal arteries.  Therefore I decided to place a suprarenal clamp to protect the renal arteries during removal of this thrombus.  The left renal vein was divided and oversewn with a 5-0 Prolene proximally and distally.  This allowed good exposure of the right renal artery.  It was dissected free circumferentially and a vessel loop placed around this.  On identification of the left renal artery it was coming off adjacent to the superior mesenteric artery.  I did not feel like it safely clamped above the left renal without clamping above the SMA.  Therefore I decided to place the clamp above the right renal with a loop on the right renal artery and below the left renal.  The patient was given 7000 units of intravenous heparin.  She was given an additional 5000 units of heparin during the course of the case.  Patient was also given 12 and half milligrams of mannitol.  The distal aorta was clamped just below the inferior mesenteric artery.  The proximal aorta was clamped above the right renal below the left renal artery and a vessel loop was used to control the right renal artery.  The aorta was then transected just  above the inferior mesenteric artery.  The distal aorta was oversewn with a running 3-0 Prolene suture.  The clamp was then removed and there was good distal aortic hemostasis.  This was oversewn just above the level of the inferior mesenteric artery.  Attention was then  turned to the proximal aorta. There was a large amount of thrombus within the aorta.  This was removed under direct vision.  A 14 x 7 mm Dacron graft was brought up in the operative field and sewn end-to-end to the infrarenal aorta using a running 3-0 Prolene suture.  Despite completion anastomosis it was tested and there was a leak anteriorly from a pleat and this was repaired with two 3-0 Prolene sutures with pledgets.  This was then hemostatic.  Clamp was then removed and coarct clamps were placed on each limb of the aortic graft.  These were then brought through the retroperitoneal tunnels.  Doppler was used to evaluate the left and right renal arteries and these both still had good flow.  Right distal external iliac profunda and superficial femoral arteries and circumflex branches were all controlled with Vesseloops on the right side.  Longitudinal opening was made in the right common femoral artery the graft was cut to length and beveled and sewn endograft to side of artery using a running 5-0 Prolene suture.  Despite completion anastomosis it was for blood backbled and thoroughly flushed.  Anastomosis was secured clamps released there is pulsatile flow in the distal SFA and profunda immediately.  The patient had PT and DP Doppler signals which were biphasic.  A similar anastomosis was then performed on the left side.  Patient also had biphasic dorsalis pedis and posterior tibial Doppler flow at this point.  There was a transient pressure drop about 10 to 15 mmHg with each leg opening.  Hemostasis was then obtained in both groins.  The patient was given 50 mg of protamine.  The abdomen was inspected again and found to be hemostatic.  The retroperitoneum was closed with a running 3-0 Vicryl suture.  The Omni retractor was removed the bowel was inspected and appeared to be healthy in appearance in the colon and small bowel.  This was returned to its normal position.  Fascia was reapproximated using a running #1  PDS suture.  The skin was closed with a 4-0 Vicryl subcuticular stitch.  Each groin was then closed in multiple layers of running 203 0 and 4-0 Vicryl subcuticular stitch in the skin.  Dermabond was applied to all 3 incisions.  The patient tolerated the procedure well and there were no complications.  The instrument sponge and needle count was correct the end of the case.  The patient was taken the recovery room in stable condition.  Fabienne Brunsharles Riese Hellard, MD [facility] Office: [phone] Pager: [phone]

## 2018-05-27 NOTE — Discharge Instructions (Signed)
 [facility]  Discharge Instructions   Open Aortic Surgery  Please refer to the following instructions for your post-procedure care. Your surgeon or Physician Assistant will discuss any changes with you.  Activity  Avoid lifting more than eight pounds (a gallon of milk) until after your first post-operative visit. You are encouraged to walk as much as you can. You can slowly return to normal activities but must avoid strenuous activity and heavy lifting until your doctor tells you it's okay. Heavy lifting can hurt the incision and cause a hernia. Avoid activities such as vacuuming or swinging a golf club. It is normal to feel tired for several weeks after your surgery. Do not drive until your doctor gives the okay and you are no longer taking prescription pain medications. It is also normal to have difficulty with sleep habits, eating and bowl movements after surgery. These will go away with time.  Bathing/Showering  Shower daily after you go home. Do not soak in a bathtub, hot tub, or swim until the incision heals.  Incision Care  Shower every day. Clean your incision with mild soap and water. Pat the area dry with a clean towel. You do not need a bandage unless otherwise instructed. Do not apply any ointments or creams to your incision. You may have skin glue on your incision. Do not peel it off. It will come off on its own in about one week. If you have staples or sutures along your incision, they will be removed at your post op appointment.  If you have groin incisions, wash the groin wounds with soap and water daily and pat dry. (No tub bath-only shower)  Then put a dry gauze or washcloth in the groin to keep this area dry to help prevent wound infection.  Do this daily and as needed.  Do not use Vaseline or neosporin on your incisions.  Only use soap and water on your incisions and then protect and keep dry.  Diet  Resume your normal diet. There are no  special food restriction following this procedure. A low fat/low cholesterol diet is recommended for all patients with vascular disease. After your aortic surgery, it's normal to feel full faster than usual and to not feel as hungry as you normally would. You will probably lose weight initially following your surgery. It's best to eat small, frequent meals over the course of the day. Call the office if you find that you are unable to eat even small meals.   In order to heal from your surgery, it is CRITICAL to get adequate nutrition. Your body requires vitamins, minerals, and protein. Vegetables are the best source of vitamins and minerals. If you have pain, you may take over-the-counter pain reliever such as acetaminophen (Tylenol). If you were prescribed a stronger pain medication, please be aware these medication can cause nausea and constipation. Prevent nausea by taking the medication with a snack or meal. Avoid constipation by drinking plenty of fluids and eating foods with a high amount of fiber, such as fruits, vegetables and grains. Take 100mg of the over-the-counter stool softener Colace twice a day as needed to help with constipation. A laxative, such as Milk of Magnesia, may be recommended for you at this time. Do not take a laxative unless your surgeon or P.A. tells you it's OK.  Do not take Tylenol if you are taking stronger pain medications (such as Percocet).  Follow Up  Our office will schedule a follow up   appointment 2-3 weeks after discharge.  Please call us immediately for any of the following conditions    .     Severe or worsening pain in your legs or feet or in your abdomen back or chest. Increased pain, redness drainage (pus) from your incision site. Increased abdominal pain, bloating, nausea, vomiting, or persistent diarrhea. Fever of 101 degrees or higher. Swelling in your leg (s).  Reduce your risk of vascular disease  Stop smoking. If you would like help, call  [facility] at 1-800-QUIT-NOW (1-800-784-8669) or [facility] at 336-586-4000. Manage your cholesterol Maintain a desired weight Control your diabetes Keep your blood pressure down  If you have any questions please call the office at [phone].   

## 2018-05-27 NOTE — Transfer of Care (Signed)
Immediate Anesthesia Transfer of Care Note  Patient: Andrea Fleming  Procedure(s) Performed: AORTA BIFEMORAL BYPASS GRAFT (Bilateral Abdomen)  Patient Location: PACU  Anesthesia Type:General  Level of Consciousness: drowsy  Airway & Oxygen Therapy: Patient Spontanous Breathing and Patient connected to face mask oxygen  Post-op Assessment: Report given to RN, Post -op Vital signs reviewed and stable and Patient moving all extremities X 4  Post vital signs: Reviewed and stable  Last Vitals:  Vitals Value Taken Time  BP 134/78 05/27/2018  2:21 PM  Temp    Pulse 76 05/27/2018  2:23 PM  Resp 17 05/27/2018  2:23 PM  SpO2 98 % 05/27/2018  2:23 PM  Vitals shown include unvalidated device data.  Last Pain:  Vitals:   05/27/18 0656  TempSrc:   PainSc: 7          Complications: No apparent anesthesia complications

## 2018-05-27 NOTE — Progress Notes (Signed)
Paged Dr. Arbie Cookey for inadequate pain control.  Patient moaning out in pain before PRN time allows more medicine.

## 2018-05-27 NOTE — Interval H&P Note (Signed)
History and Physical Interval Note:  05/27/2018 7:52 AM  Andrea Fleming  has presented today for surgery, with the diagnosis of PERIPHERAL ARTERY DISEASE.  The various methods of treatment have been discussed with the patient and family. After consideration of risks, benefits and other options for treatment, the patient has consented to  Procedure(s): AORTA BIFEMORAL BYPASS GRAFT (Bilateral) as a surgical intervention.  The patient's history has been reviewed, patient examined, no change in status, stable for surgery.  I have reviewed the patient's chart and labs.  Questions were answered to the patient's satisfaction.     Fabienne Bruns

## 2018-05-28 ENCOUNTER — Encounter (HOSPITAL_COMMUNITY): Payer: Self-pay | Admitting: Vascular Surgery

## 2018-05-28 ENCOUNTER — Inpatient Hospital Stay (HOSPITAL_COMMUNITY): Payer: 59

## 2018-05-28 LAB — COMPREHENSIVE METABOLIC PANEL
ALT: 17 U/L (ref 0–44)
AST: 24 U/L (ref 15–41)
Albumin: 3.2 g/dL — ABNORMAL LOW (ref 3.5–5.0)
Alkaline Phosphatase: 96 U/L (ref 38–126)
Anion gap: 10 (ref 5–15)
BUN: 12 mg/dL (ref 6–20)
CO2: 20 mmol/L — ABNORMAL LOW (ref 22–32)
Calcium: 8.3 mg/dL — ABNORMAL LOW (ref 8.9–10.3)
Chloride: 106 mmol/L (ref 98–111)
Creatinine, Ser: 1.37 mg/dL — ABNORMAL HIGH (ref 0.44–1.00)
GFR calc Af Amer: 50 mL/min — ABNORMAL LOW (ref >60)
GFR calc non Af Amer: 43 mL/min — ABNORMAL LOW (ref >60)
Glucose, Bld: 152 mg/dL — ABNORMAL HIGH (ref 70–99)
Potassium: 4.2 mmol/L (ref 3.5–5.1)
Sodium: 136 mmol/L (ref 135–145)
Total Bilirubin: 0.5 mg/dL (ref 0.3–1.2)
Total Protein: 5.6 g/dL — ABNORMAL LOW (ref 6.5–8.1)

## 2018-05-28 LAB — POCT I-STAT 7, (LYTES, BLD GAS, ICA,H+H)
Acid-base deficit: 2 mmol/L (ref 0.0–2.0)
Bicarbonate: 23.5 mmol/L (ref 20.0–28.0)
Calcium, Ion: 1.13 mmol/L — ABNORMAL LOW (ref 1.15–1.40)
HCT: 26 % — ABNORMAL LOW (ref 36.0–46.0)
Hemoglobin: 8.8 g/dL — ABNORMAL LOW (ref 12.0–15.0)
O2 Saturation: 100 %
Potassium: 4 mmol/L (ref 3.5–5.1)
Sodium: 137 mmol/L (ref 135–145)
TCO2: 25 mmol/L (ref 22–32)
pCO2 arterial: 43.5 mmHg (ref 32.0–48.0)
pH, Arterial: 7.34 — ABNORMAL LOW (ref 7.350–7.450)
pO2, Arterial: 223 mmHg — ABNORMAL HIGH (ref 83.0–108.0)

## 2018-05-28 LAB — CBC
HCT: 38.2 % (ref 36.0–46.0)
Hemoglobin: 12.4 g/dL (ref 12.0–15.0)
MCH: 32.6 pg (ref 26.0–34.0)
MCHC: 32.5 g/dL (ref 30.0–36.0)
MCV: 100.5 fL — ABNORMAL HIGH (ref 80.0–100.0)
Platelets: 283 10*3/uL (ref 150–400)
RBC: 3.8 MIL/uL — ABNORMAL LOW (ref 3.87–5.11)
RDW: 13.9 % (ref 11.5–15.5)
WBC: 19.8 10*3/uL — ABNORMAL HIGH (ref 4.0–10.5)
nRBC: 0 % (ref 0.0–0.2)

## 2018-05-28 LAB — MAGNESIUM: Magnesium: 2.4 mg/dL (ref 1.7–2.4)

## 2018-05-28 LAB — AMYLASE: Amylase: 40 U/L (ref 28–100)

## 2018-05-28 MED ORDER — CHLORHEXIDINE GLUCONATE CLOTH 2 % EX PADS
6.0000 | MEDICATED_PAD | Freq: Every day | CUTANEOUS | Status: DC
  Administered 2018-05-28 – 2018-06-01 (×5): 6 via TOPICAL

## 2018-05-28 MED ORDER — SODIUM CHLORIDE 0.9 % BOLUS PEDS: 500.0000 mL | Freq: Once | INTRAVENOUS | Status: DC

## 2018-05-28 MED ORDER — SODIUM CHLORIDE 0.9% FLUSH: 10.0000 mL | INTRAVENOUS | Status: DC | PRN

## 2018-05-28 MED ORDER — SODIUM CHLORIDE 0.9% FLUSH
10.0000 mL | Freq: Two times a day (BID) | INTRAVENOUS | Status: DC
  Administered 2018-05-28: 20 mL
  Administered 2018-05-28 – 2018-06-01 (×6): 10 mL

## 2018-05-28 MED ORDER — ORAL CARE MOUTH RINSE
15.0000 mL | Freq: Two times a day (BID) | OROMUCOSAL | Status: DC
  Administered 2018-05-28 – 2018-06-01 (×6): 15 mL via OROMUCOSAL

## 2018-05-28 MED ORDER — SODIUM CHLORIDE 0.9 % IV BOLUS
500.0000 mL | Freq: Once | INTRAVENOUS | Status: AC
  Administered 2018-05-28: 500 mL via INTRAVENOUS
  Administered 2018-05-28: 19:00:00 via INTRAVENOUS

## 2018-05-28 MED ORDER — SALINE SPRAY 0.65 % NA SOLN
1.0000 | NASAL | Status: DC | PRN
  Administered 2018-05-28: 1 via NASAL
  Filled 2018-05-28: qty 44

## 2018-05-28 NOTE — Plan of Care (Signed)
  Problem: Clinical Measurements: Goal: Will remain free from infection Outcome: Progressing   Problem: Clinical Measurements: Goal: Diagnostic test results will improve Outcome: Progressing   Problem: Clinical Measurements: Goal: Respiratory complications will improve Outcome: Progressing   Problem: Clinical Measurements: Goal: Cardiovascular complication will be avoided Outcome: Progressing   Problem: Coping: Goal: Level of anxiety will decrease Outcome: Progressing   Problem: Elimination: Goal: Will not experience complications related to bowel motility Outcome: Progressing   Problem: Elimination: Goal: Will not experience complications related to urinary retention Outcome: Progressing   Problem: Pain Managment: Goal: General experience of comfort will improve Outcome: Progressing

## 2018-05-28 NOTE — Evaluation (Signed)
Physical Therapy Evaluation Patient Details Name: Andrea Fleming MRN: [id number] DOB: [birth year] Today's Date: 05/28/2018   History of Present Illness  Pt is a 58 y/o female with PAD who was experiencing gangrenous changes of her right fifth toe as well as short distance claudication. She is now s/p AORTA BIFEMORAL BYPASS GRAFT (Bilateral). Pt has a PMH including Allergy, Condyloma (1993), Depression, and Hyperlipidemia.  Clinical Impression  Pt admitted with above diagnosis. Pt currently with functional limitations due to the deficits listed below (see PT Problem List). Pt was able to ambulate to recliner with +2 min assist HHA for balance and safety.  Limited progression due to significant pain. Pt states she felt better staying up in chair.  Will follow for progression.  Pt will benefit from skilled PT to increase their independence and safety with mobility to allow discharge to the venue listed below.     Follow Up Recommendations Home health PT;Supervision - Intermittent(if confusion clears)    Equipment Recommendations  Other (comment)(TBA)    Recommendations for Other Services       Precautions / Restrictions Precautions Precautions: Fall Restrictions Weight Bearing Restrictions: No      Mobility  Bed Mobility Overal bed mobility: Needs Assistance Bed Mobility: Supine to Sit     Supine to sit: Min assist;HOB elevated     General bed mobility comments: cues for safety, use of bed rail, elevated HOB, min A for trunk elevation and scooting to EOB  Transfers Overall transfer level: Needs assistance Equipment used: 2 person hand held assist Transfers: Sit to/from Stand Sit to Stand: Min assist;+2 safety/equipment;From elevated surface         General transfer comment: from EOB (ICU bed higher surface), min A for balance and +2 for line management and safety  Ambulation/Gait Ambulation/Gait assistance: Min assist;+2 safety/equipment Gait Distance (Feet): 5  Feet Assistive device: 2 person hand held assist Gait Pattern/deviations: Step-through pattern;Decreased stride length   Gait velocity interpretation: <1.31 ft/sec, indicative of household ambulator General Gait Details: Took a few steps to chair. Needed HHA.   Stairs            Wheelchair Mobility    Modified Rankin (Stroke Patients Only)       Balance Overall balance assessment: Needs assistance Sitting-balance support: Single extremity supported Sitting balance-Leahy Scale: Good Sitting balance - Comments: EOB able to demonstrate some leans to don socks   Standing balance support: Bilateral upper extremity supported Standing balance-Leahy Scale: Poor Standing balance comment: external assist for balance                             Pertinent Vitals/Pain Pain Assessment: Faces Faces Pain Scale: Hurts worst Pain Location: BLE Pain Descriptors / Indicators: Crying;Grimacing;Sore Pain Intervention(s): Limited activity within patient's tolerance;Monitored during session;Premedicated before session;Repositioned;Patient requesting pain meds-RN notified    Home Living Family/patient expects to be discharged to:: Private residence Living Arrangements: Alone Available Help at Discharge: Friend(s);Available PRN/intermittently(ex sister in law) Type of Home: House Home Access: Stairs to enter   Enter[employer] CorporationEntrance Stairs-Number of Steps: 2 Home Layout: One level Home Equipment: None      Prior Function Level of Independence: Independent         Comments: 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[employer], drives, does all her own ADL and IADL     Hand Dominance   Dominant Hand: Right    Extremity/Trunk Assessment   Upper Extremity Assessment Upper Extremity  Assessment: Defer to OT evaluation    Lower Extremity Assessment Lower Extremity Assessment: Generalized weakness    Cervical / Trunk Assessment Cervical / Trunk Assessment: Normal  Communication    Communication: No difficulties  Cognition Arousal/Alertness: Awake/alert;Suspect due to medications(premedicated with morphine and dilaudid) Behavior During Therapy: [facility] for tasks assessed/performed(eager to participate borderline impulsive) Overall Cognitive Status: Impaired/Different from baseline Area of Impairment: Orientation;Memory;Following commands;Safety/judgement;Problem solving                 Orientation Level: Disoriented to;Time(thought she'd already been discharged once)   Memory: Decreased recall of precautions;Decreased short-term memory Following Commands: Follows one step commands consistently Safety/Judgement: Decreased awareness of safety;Decreased awareness of deficits   Problem Solving: Requires verbal cues General Comments: no line safety awareness, confusion, borderline impulsive vs eager to prove she can do things      General Comments General comments (skin integrity, edema, etc.): on 1.5 L o2 when therapy entered the room, during activty her O2 went down to 87, O2 reapplied and returned to 95%, other VSS    Exercises     Assessment/Plan    PT Assessment Patient needs continued PT services  PT Problem List Decreased activity tolerance;Decreased balance;Decreased mobility;Decreased knowledge of use of DME;Decreased knowledge of precautions;Decreased safety awareness;Pain       PT Treatment Interventions DME instruction;Gait training;Functional mobility training;Therapeutic activities;Therapeutic exercise;Balance training;Cognitive remediation;Patient/family education    PT Goals (Current goals can be found in the Care Plan section)  Acute Rehab PT Goals Patient Stated Goal: "have no more leg pain" PT Goal Formulation: With patient Time For Goal Achievement: 06/11/18 Potential to Achieve Goals: Good    Frequency Min 3X/week   Barriers to discharge Decreased caregiver support      Co-evaluation PT/OT/SLP Co-Evaluation/Treatment: Yes Reason  for Co-Treatment: Necessary to address cognition/behavior during functional activity;For patient/therapist safety PT goals addressed during session: Mobility/safety with mobility         AM-PAC PT "6 Clicks" Mobility  Outcome Measure Help needed turning from your back to your side while in a flat bed without using bedrails?: A Little Help needed moving from lying on your back to sitting on the side of a flat bed without using bedrails?: A Little Help needed moving to and from a bed to a chair (including a wheelchair)?: A Little Help needed standing up from a chair using your arms (e.g., wheelchair or bedside chair)?: A Little Help needed to walk in hospital room?: A Little Help needed climbing 3-5 steps with a railing? : A Little 6 Click Score: 18    End of Session Equipment Utilized During Treatment: Gait belt;Oxygen Activity Tolerance: Patient limited by fatigue Patient left: in chair;with call bell/phone within reach;with chair alarm set Nurse Communication: Mobility status PT Visit Diagnosis: Unsteadiness on feet (R26.81);Muscle weakness (generalized) (M62.81);Pain Pain - part of body: (groin, lower abdomen)    Time: 0370-4888 PT Time Calculation (min) (ACUTE ONLY): 23 min   Charges:   PT Evaluation $PT Eval Moderate Complexity: 1 Mod          Angeleigh Chiasson,PT Acute Rehabilitation Services Pager:  [phone]  Office:  [phone]    Berline Lopes 05/28/2018, 12:59 PM

## 2018-05-28 NOTE — Progress Notes (Addendum)
Patient alert to self and place but confused to time.  Emotional this morning since last PRN dose of dilaudid was given.  Confusion noticed with dilaudid that was not noticed with morphine although patient has had better pain relief with dilaudid.  Patient asking about eating and taking NG tube out.  Discussed with patient importance of keeping NG tube in place and that surgeon will be around to see her this morning. Art line pressures have been adequate and do not correlate with cuff pressure.  Per nurse before we are following Arterial line blood pressure.  Patient has been mentating without complications except with slight confusion after dilaudid.

## 2018-05-28 NOTE — Anesthesia Postprocedure Evaluation (Signed)
Anesthesia Post Note  Patient: Andrea Fleming  Procedure(s) Performed: AORTA BIFEMORAL BYPASS GRAFT (Bilateral Abdomen)     Patient location during evaluation: PACU Anesthesia Type: General Level of consciousness: awake and alert Pain management: pain level controlled Vital Signs Assessment: post-procedure vital signs reviewed and stable Respiratory status: spontaneous breathing, nonlabored ventilation, respiratory function stable and patient connected to nasal cannula oxygen Cardiovascular status: blood pressure returned to baseline and stable Postop Assessment: no apparent nausea or vomiting Anesthetic complications: no    Last Vitals:  Vitals:   05/28/18 0500 05/28/18 0530  BP: (!) 84/62   Pulse: 90 95  Resp: 16 13  Temp:    SpO2: 94% 93%    Last Pain:  Vitals:   05/28/18 0508  TempSrc:   PainSc: Asleep                 Runa Whittingham COKER

## 2018-05-28 NOTE — Progress Notes (Signed)
Dr. Arbie Cookey called back and discussed inadequate pain control.  MD gave order for 1-2 mg IV dilaudid q1H PRN severe pain.

## 2018-05-28 NOTE — Progress Notes (Signed)
Initial Nutrition Assessment  DOCUMENTATION CODES:   Not applicable  INTERVENTION:    Ensure Enlive po BID, each supplement provides 350 kcal and 20 grams of protein once diet advanced   MVI daily  NUTRITION DIAGNOSIS:   Increased nutrient needs related to post-op healing as evidenced by estimated needs  GOAL:   Patient will meet greater than or equal to 90% of their needs  MONITOR:   Diet advancement, Supplement acceptance, PO intake, Labs, Skin, I & O's, Weight trends  REASON FOR ASSESSMENT:   Malnutrition Screening Tool    ASSESSMENT:   Patient with PMH significant for HLD and PAD. Presents this admission with gangrenous changes of her right fifth toe. 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    5/20- Aortobifemoral bypass, ligation left renal vein  RD working remotely.  Attempted to call pt x2, no answer. She is currently NPO with NGT in place. Abdomen noted to be distended. No BM documented since admit. RD to provide supplements once diet is advanced. Will attempt to obtain history if possible.   Per chart records pt weighed 61.7 kg on 4/30 and 63.9 kg this admission. Do not suspect wt loss but will need to confirm with pt.   No edema documented.   I/O: +3,901 ml since admit UOP: 1,675 ml x 24 hrs  NGT: 250 ml x 24 hrs   Drips: D5 in  1.2NS @ 100 ml/hr  Medications: colace Labs: CBG 152-209 calcium ionized 1.13 (L)   Diet Order:   Diet Order            Diet NPO time specified  Diet effective now              EDUCATION NEEDS:   Not appropriate for education at this time  Skin:  Skin Assessment: Skin Integrity Issues: Skin Integrity Issues:: Incisions Incisions: abdomen, R/L groin   Last BM:  PTA  Height:   Ht Readings from Last 1 Encounters:  05/27/18 5\' 4"  (1.626 m)    Weight:   Wt Readings from Last 1 Encounters:  05/28/18 63.9  kg    Ideal Body Weight:  54.5 kg  BMI:  Body mass index is 24.18 kg/m.  Estimated Nutritional Needs:   Kcal:  1600-1800 kcal  Protein:  80-95 grams  Fluid:  >/= 1.6 L/day    Vanessa Kick RD, LDN Clinical Nutrition Pager # - [phone]

## 2018-05-28 NOTE — Evaluation (Signed)
Occupational Therapy Evaluation Patient Details Name: Andrea Fleming MRN: [id number] DOB: 01-Apr-1960 Today's Date: 05/28/2018    History of Present Illness Pt is a 58 y/o female with PAD who was experiencing gangrenous changes of her right fifth toe as well as short distance claudication. She is now s/p AORTA BIFEMORAL BYPASS GRAFT (Bilateral). Pt has a PMH including Allergy, Condyloma (1993), Depression, and Hyperlipidemia.   Clinical Impression   Prior to admission, Pt lives alone and was independent in ADL and mobility. She walks without DME despite severe pain in BLE the past few months. 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 She does have a small dog at home.  Pt awake and tearful due to pain initially. She was able to perform bed mobility at min A, transfers at min A +2 for line management and safety, donned R sock with set up and mod A for L sock. UB grooming at min guard (watching NG tube safety). She is demonstrating confusion and decreased orientation, slight impulsivity, and safety awareness. (see cognitive section below). At this time she requires skilled OT in the acute setting. She will go home alone and have intermittent assist from her ex-sister in law so watch carefully for progress or consider increased OT. Suspect that as pain management improves, and cognition improves she should be able to dc without OT follow up. Next session plan on AE for LB (LLE) dressing as well as transfers and DME education.     Follow Up Recommendations  No OT follow up;Supervision - Intermittent    Equipment Recommendations  None recommended by OT(monitor closely)    Recommendations for Other Services       Precautions / Restrictions Precautions Precautions: Fall Restrictions Weight Bearing Restrictions: No      Mobility Bed Mobility Overal bed mobility: Needs Assistance Bed Mobility: Supine to Sit     Supine  to sit: Min assist;HOB elevated     General bed mobility comments: cues for safety, use of bed rail, elevated HOB, min A for trunk elevation and scooting to EOB  Transfers Overall transfer level: Needs assistance Equipment used: 2 person hand held assist Transfers: Sit to/from Stand Sit to Stand: Min assist;+2 safety/equipment;From elevated surface         General transfer comment: from EOB (ICU bed higher surface), min A for balance and +2 for line management and safety    Balance Overall balance assessment: Needs assistance Sitting-balance support: Single extremity supported Sitting balance-Leahy Scale: Good Sitting balance - Comments: EOB able to demonstrate some leans to don socks   Standing balance support: Bilateral upper extremity supported Standing balance-Leahy Scale: Poor Standing balance comment: external assist for balance                           ADL either performed or assessed with clinical judgement   ADL Overall ADL's : Needs assistance/impaired Eating/Feeding: NPO   Grooming: Wash/dry face;Set up;Sitting Grooming Details (indicate cue type and reason): watching closely for NG tube safety Upper Body Bathing: Min guard;Sitting   Lower Body Bathing: Min guard;Sitting/lateral leans   Upper Body Dressing : Set up;Sitting   Lower Body Dressing: Set up;Sitting/lateral leans;Moderate assistance Lower Body Dressing Details (indicate cue type and reason): able to don R sock sitting on the EOB, but required assist for the L Toilet Transfer: Minimal assistance;+2 for safety/equipment;Ambulation(2 person HHA, and line management)  Toileting- Clothing Manipulation and Hygiene: Moderate assistance;Sit to/from stand;+2 for safety/equipment       Functional mobility during ADLs: Minimal assistance;+2 for safety/equipment;Cueing for safety General ADL Comments: decreased access to LLE, decreased balance, cognition impacting independence in ADL      Vision Baseline Vision/History: Wears glasses Wears Glasses: Reading only Patient Visual Report: No change from baseline       Perception     Praxis      Pertinent Vitals/Pain Pain Assessment: Faces Faces Pain Scale: Hurts worst Pain Location: BLE Pain Descriptors / Indicators: Crying;Grimacing;Sore Pain Intervention(s): Monitored during session;Repositioned;Premedicated before session     Hand Dominance Right   Extremity/Trunk Assessment Upper Extremity Assessment Upper Extremity Assessment: Overall WFL for tasks assessed   Lower Extremity Assessment Lower Extremity Assessment: Defer to PT evaluation   Cervical / Trunk Assessment Cervical / Trunk Assessment: Normal   Communication Communication Communication: No difficulties   Cognition Arousal/Alertness: Awake/alert;Suspect due to medications(premedicated with morphine and dilaudid) Behavior During Therapy: [facility] for tasks assessed/performed(eager to participate borderline impulsive) Overall Cognitive Status: Impaired/Different from baseline Area of Impairment: Orientation;Memory;Following commands;Safety/judgement;Problem solving                 Orientation Level: Disoriented to;Time(thought she'd already been discharged once)   Memory: Decreased recall of precautions;Decreased short-term memory Following Commands: Follows one step commands consistently Safety/Judgement: Decreased awareness of safety;Decreased awareness of deficits   Problem Solving: Requires verbal cues General Comments: no line safety awareness, confusion, borderline impulsive vs eager to prove she can do things   General Comments  on 1.5 L o2 when therapy entered the room, during activty her O2 went down to 87, O2 reapplied and returned to 95%, other VSS    Exercises     Shoulder Instructions      Home Living Family/patient expects to be discharged to:: Private residence Living Arrangements: Alone Available Help at Discharge:  Friend(s);Available PRN/intermittently(ex sister in law) Type of Home: House Home Access: Stairs to enter [employer] of Steps: 2   Home Layout: One level     Bathroom Shower/Tub: Chief Strategy Officer: Standard     Home Equipment: None          Prior Functioning/Environment Level of Independence: Independent        Comments: works as a Diplomatic Services operational officer at [employer], drives, does all her own ADL and IADL        OT Problem List: Decreased range of motion;Impaired balance (sitting and/or standing);Decreased cognition;Decreased safety awareness;Decreased knowledge of use of DME or AE;Decreased knowledge of precautions;Pain      OT Treatment/Interventions: Self-care/ADL training;DME and/or AE instruction;Therapeutic activities;Patient/family education;Balance training    OT Goals(Current goals can be found in the care plan section) Acute Rehab OT Goals Patient Stated Goal: "have no more leg pain" OT Goal Formulation: With patient Time For Goal Achievement: 06/11/18 Potential to Achieve Goals: Good ADL Goals Pt Will Perform Grooming: with modified independence;standing Pt Will Perform Lower Body Bathing: with modified independence;with adaptive equipment;sit to/from stand Pt Will Perform Lower Body Dressing: with modified independence;with adaptive equipment;sit to/from stand Pt Will Transfer to Toilet: with modified independence;ambulating Pt Will Perform Toileting - Clothing Manipulation and hygiene: with modified independence;sit to/from stand  OT Frequency: Min 2X/week   Barriers to D/C:            Co-evaluation PT/OT/SLP Co-Evaluation/Treatment: Yes Reason for Co-Treatment: Necessary to address cognition/behavior during functional activity;For patient/therapist safety;To address functional/ADL transfers PT goals addressed during session:  Mobility/safety with mobility;Balance OT goals addressed during session: ADL's and self-care       AM-PAC OT "6 Clicks" Daily Activity     Outcome Measure Help from another person eating meals?: Total(NPO) Help from another person taking care of personal grooming?: A Little Help from another person toileting, which includes using toliet, bedpan, or urinal?: A Lot Help from another person bathing (including washing, rinsing, drying)?: A Little Help from another person to put on and taking off regular upper body clothing?: A Little Help from another person to put on and taking off regular lower body clothing?: A Little 6 Click Score: 15   End of Session Equipment Utilized During Treatment: Oxygen(1.5) Nurse Communication: Mobility status;Other (comment)(A line needs re-cailbration)  Activity Tolerance: Patient tolerated treatment well Patient left: in chair;with chair alarm set;with call bell/phone within reach;with nursing/sitter in room  OT Visit Diagnosis: Other abnormalities of gait and mobility (R26.89);Other symptoms and signs involving cognitive function;Pain Pain - Right/Left: Right(bilateral) Pain - part of body: Leg                Time: 5993-5701 OT Time Calculation (min): 24 min Charges:  OT General Charges $OT Visit: 1 Visit OT Evaluation $OT Eval Moderate Complexity: 1 Mod  Andrea Fleming OTR/L Acute Rehabilitation Services Pager: [phone] Office: [phone]  Andrea Fleming Andrea Fleming 05/28/2018, 10:09 AM

## 2018-05-28 NOTE — Progress Notes (Signed)
Patient ID: Andrea Fleming, female   DOB: 25-Nov-1960, 58 y.o.   MRN: [id number]  Progress Note    05/28/2018 3:36 PM 1 Day Post-Op  Subjective: Significant incisional soreness.  Some relief with narcotic.  Asking for something to drink.   Vitals:   05/28/18 1317 05/28/18 1500  BP: 98/62 (!) 86/58  Pulse: 95 (!) 101  Resp: 20   Temp:    SpO2: 97% 90%   Physical Exam: Abdominal incisional soreness.  Also sore in her groins.  2-3+ dorsalis pedis pulses bilaterally  CBC    Component Value Date/Time   WBC 19.8 (H) 05/28/2018 0437   RBC 3.80 (L) 05/28/2018 0437   HGB 12.4 05/28/2018 0437   HCT 38.2 05/28/2018 0437   PLT 283 05/28/2018 0437   MCV 100.5 (H) 05/28/2018 0437   MCH 32.6 05/28/2018 0437   MCHC 32.5 05/28/2018 0437   RDW 13.9 05/28/2018 0437   LYMPHSABS 4.9 (H) 11/17/2015 1114   MONOABS 0.7 11/17/2015 1114   EOSABS 0.1 11/17/2015 1114   BASOSABS 0.0 11/17/2015 1114    BMET    Component Value Date/Time   NA 136 05/28/2018 0437   K 4.2 05/28/2018 0437   CL 106 05/28/2018 0437   CO2 20 (L) 05/28/2018 0437   GLUCOSE 152 (H) 05/28/2018 0437   BUN 12 05/28/2018 0437   CREATININE 1.37 (H) 05/28/2018 0437   CALCIUM 8.3 (L) 05/28/2018 0437   GFRNONAA 43 (L) 05/28/2018 0437   GFRAA 50 (L) 05/28/2018 0437    INR    Component Value Date/Time   INR 1.2 05/27/2018 1413     Intake/Output Summary (Last 24 hours) at 05/28/2018 1536 Last data filed at 05/28/2018 1500 Gross per 24 hour  Intake 3825.5 ml  Output 1150 ml  Net 2675.5 ml     Assessment/Plan:  58 y.o. female stable postop day 1. 250 cc urine output overnight.  50 today.  Has received 500 cc bolus.  350 cc NG output.  Will keep NG until tomorrow.  Mobilize.    Larina Earthly, MD FACS [facility] [phone] 05/28/2018 3:36 PM

## 2018-05-29 LAB — BASIC METABOLIC PANEL
Anion gap: 10 (ref 5–15)
BUN: 18 mg/dL (ref 6–20)
CO2: 22 mmol/L (ref 22–32)
Calcium: 8.3 mg/dL — ABNORMAL LOW (ref 8.9–10.3)
Chloride: 103 mmol/L (ref 98–111)
Creatinine, Ser: 1.21 mg/dL — ABNORMAL HIGH (ref 0.44–1.00)
GFR calc Af Amer: 58 mL/min — ABNORMAL LOW (ref >60)
GFR calc non Af Amer: 50 mL/min — ABNORMAL LOW (ref >60)
Glucose, Bld: 137 mg/dL — ABNORMAL HIGH (ref 70–99)
Potassium: 4.1 mmol/L (ref 3.5–5.1)
Sodium: 135 mmol/L (ref 135–145)

## 2018-05-29 LAB — CBC
HCT: 31.4 % — ABNORMAL LOW (ref 36.0–46.0)
Hemoglobin: 10.7 g/dL — ABNORMAL LOW (ref 12.0–15.0)
MCH: 33.5 pg (ref 26.0–34.0)
MCHC: 34.1 g/dL (ref 30.0–36.0)
MCV: 98.4 fL (ref 80.0–100.0)
Platelets: 210 10*3/uL (ref 150–400)
RBC: 3.19 MIL/uL — ABNORMAL LOW (ref 3.87–5.11)
RDW: 13.9 % (ref 11.5–15.5)
WBC: 19.2 10*3/uL — ABNORMAL HIGH (ref 4.0–10.5)
nRBC: 0 % (ref 0.0–0.2)

## 2018-05-29 MED ORDER — ALPRAZOLAM 0.5 MG PO TABS
0.5000 mg | ORAL_TABLET | Freq: Every evening | ORAL | Status: DC | PRN
  Administered 2018-05-29 – 2018-05-31 (×2): 0.5 mg via ORAL
  Filled 2018-05-29 (×2): qty 1

## 2018-05-29 NOTE — Progress Notes (Signed)
Patient ID: Andrea Fleming, female   DOB: 03-06-1960, 58 y.o.   MRN: [id number] Comfortable on 4 E. Her nurse was questioning findings on her abdominal wall.  She does have some fullness but no evidence of erythema to suggest hematoma or fascial dehiscence.  We will continue to watch

## 2018-05-29 NOTE — Progress Notes (Signed)
Occupational Therapy Treatment Patient Details Name: Andrea Fleming MRN: [id number] DOB: [birth year] Today's Date: 05/29/2018    History of present illness Pt is a 58 y/o female with PAD who was experiencing gangrenous changes of her right fifth toe as well as short distance claudication. She is now s/p AORTA BIFEMORAL BYPASS GRAFT (Bilateral). Pt has a PMH including Allergy, Condyloma (1993), Depression, and Hyperlipidemia.   OT comments  Pt making progress towards OT goals in that she was able to ambulate in room with RW and min A +2 for lines and safety. However, cognitive deficits increased this session. She cried throughout session, able to be minimally distracted talking about her dog and listening to music. She continues to desat on RA (down to 85) which she removed her O2 frequently to blow nose throughout session. Pt cried out "Just leave me in a hole I want to die" but cannot describe pain or locate it. She did have several bouts of nausea - but no vomit. OT will continue to follow acutely. Pt lives alone and so will monitor progress closely for need of post-acute rehab level care.   Follow Up Recommendations  Home health OT    Equipment Recommendations  3 in 1 bedside commode    Recommendations for Other Services      Precautions / Restrictions Precautions Precautions: Fall Restrictions Weight Bearing Restrictions: No       Mobility Bed Mobility Overal bed mobility: Needs Assistance Bed Mobility: Supine to Sit     Supine to sit: Min assist;HOB elevated     General bed mobility comments: cues for safety, use of bed rail, elevated HOB, min A for trunk elevation and scooting to EOB  Transfers Overall transfer level: Needs assistance Equipment used: 1 person hand held assist;Rolling walker (2 wheeled) Transfers: Sit to/from [employer] Sit to Stand: Min assist;+2 safety/equipment(with RW) Stand pivot transfers: Min assist;From elevated surface        General transfer comment: ICU bed to transfer chair min A SPT, then min A +2 for safety with cues for ambulation in room from transfer chair to recliner    Balance Overall balance assessment: Needs assistance Sitting-balance support: Single extremity supported Sitting balance-Leahy Scale: Good Sitting balance - Comments: EOB able to demonstrate some leans to don socks   Standing balance support: Bilateral upper extremity supported Standing balance-Leahy Scale: Poor Standing balance comment: external assist for balance                           ADL either performed or assessed with clinical judgement   ADL Overall ADL's : Needs assistance/impaired Eating/Feeding: NPO   Grooming: Wash/dry face;Set up;Sitting               Lower Body Dressing: Set up;Sitting/lateral leans Lower Body Dressing Details (indicate cue type and reason): to don socks Toilet Transfer: Minimal assistance;+2 for safety/equipment;Ambulation;RW Toilet Transfer Details (indicate cue type and reason): +2 for line management and Pt requires cues for safety with RW         Functional mobility during ADLs: Minimal assistance;+2 for safety/equipment;Rolling walker;Cueing for safety General ADL Comments: decreased cognition, labile     Vision       Perception     Praxis      Cognition Arousal/Alertness: Awake/alert;Suspect due to medications Behavior During Therapy: Anxious;Impulsive Overall Cognitive Status: Impaired/Different from baseline Area of Impairment: Orientation;Memory;Following commands;Safety/judgement;Problem solving  Orientation Level: Disoriented to;Place;Time;Situation   Memory: Decreased recall of precautions;Decreased short-term memory Following Commands: Follows one step commands consistently Safety/Judgement: Decreased awareness of safety;Decreased awareness of deficits   Problem Solving: Difficulty sequencing;Requires verbal cues General  Comments: crying throughout session today, inconsolable. "I just want to crawl in a hole and die" but cannot describe pain or where it is. When OT played music to try and distract Pt she kept saying "Is this [name] and his daughters?" Could not identify where she was or what was going on.        Exercises     Shoulder Instructions       General Comments desats to 85 on RA (Pt taking out frequently to blow nose bc of crying)    Pertinent Vitals/ Pain       Pain Assessment: Faces Faces Pain Scale: Hurts worst Pain Location: generalized/abdomen Pain Descriptors / Indicators: Crying;Grimacing;Sore Pain Intervention(s): Monitored during session;Repositioned;Premedicated before session  Home Living                                          Prior Functioning/Environment              Frequency  Min 2X/week        Progress Toward Goals  OT Goals(current goals can now be found in the care plan section)  Progress towards OT goals: Progressing toward goals(cognitive changes - crying throughout)  Acute Rehab OT Goals Patient Stated Goal: "have no more leg pain" OT Goal Formulation: With patient Time For Goal Achievement: 06/11/18 Potential to Achieve Goals: Good  Plan Frequency remains appropriate;Discharge plan needs to be updated    Co-evaluation                 AM-PAC OT "6 Clicks" Daily Activity     Outcome Measure   Help from another person eating meals?: Total(NPO) Help from another person taking care of personal grooming?: A Little Help from another person toileting, which includes using toliet, bedpan, or urinal?: A Lot Help from another person bathing (including washing, rinsing, drying)?: A Little Help from another person to put on and taking off regular upper body clothing?: A Little Help from another person to put on and taking off regular lower body clothing?: A Little 6 Click Score: 15    End of Session Equipment Utilized During  Treatment: Oxygen;Gait belt;Rolling walker(2L)  OT Visit Diagnosis: Other abnormalities of gait and mobility (R26.89);Other symptoms and signs involving cognitive function;Pain Pain - part of body: (generalized/abdominal)   Activity Tolerance Patient tolerated treatment well;Other (comment)(crying throughout entire session)   Patient Left in chair;with chair alarm set;with call bell/phone within reach;with nursing/sitter in room   Nurse Communication Mobility status        Time: 1100-1140 OT Time Calculation (min): 40 min  Charges: OT General Charges $OT Visit: 1 Visit OT Treatments $Self Care/Home Management : 23-37 mins $Therapeutic Activity: 8-22 mins  Sherryl MangesLaura Maurianna Benard OTR/L Acute Rehabilitation Services Pager: [phone] Office: [phone]   Andrea Fleming 05/29/2018, 1:38 PM

## 2018-05-29 NOTE — Progress Notes (Signed)
Patient ID: Andrea Fleming, female   DOB: 1960-01-28, 58 y.o.   MRN: [id number]  Progress Note    05/29/2018 10:41 AM 2 Days Post-Op  Subjective: Comfortable this morning.  Smiling and talkative.  Was anxious last night.  Does report continued abdominal soreness.  Denies nausea.   Vitals:   05/29/18 0800 05/29/18 0900  BP: 118/73 114/66  Pulse: 99 94  Resp: (!) 27 (!) 27  Temp:    SpO2: 94% 92%   Physical Exam: Abdomen soft with appropriate tenderness in midline.  2+ dorsalis pedis pulses bilaterally  CBC    Component Value Date/Time   WBC 19.2 (H) 05/29/2018 0455   RBC 3.19 (L) 05/29/2018 0455   HGB 10.7 (L) 05/29/2018 0455   HCT 31.4 (L) 05/29/2018 0455   PLT 210 05/29/2018 0455   MCV 98.4 05/29/2018 0455   MCH 33.5 05/29/2018 0455   MCHC 34.1 05/29/2018 0455   RDW 13.9 05/29/2018 0455   LYMPHSABS 4.9 (H) 11/17/2015 1114   MONOABS 0.7 11/17/2015 1114   EOSABS 0.1 11/17/2015 1114   BASOSABS 0.0 11/17/2015 1114    BMET    Component Value Date/Time   NA 135 05/29/2018 0455   K 4.1 05/29/2018 0455   CL 103 05/29/2018 0455   CO2 22 05/29/2018 0455   GLUCOSE 137 (H) 05/29/2018 0455   BUN 18 05/29/2018 0455   CREATININE 1.21 (H) 05/29/2018 0455   CALCIUM 8.3 (L) 05/29/2018 0455   GFRNONAA 50 (L) 05/29/2018 0455   GFRAA 58 (L) 05/29/2018 0455    INR    Component Value Date/Time   INR 1.2 05/27/2018 1413     Intake/Output Summary (Last 24 hours) at 05/29/2018 1041 Last data filed at 05/29/2018 1000 Gross per 24 hour  Intake 2984.15 ml  Output 1420 ml  Net 1564.15 ml     Assessment/Plan:  58 y.o. female stable overall.  Urine output 300 cc for 12 hours.  We will continue IV fluids.  Minimal output, NG and therefore will discontinue.  Transfer to 4 E.     Larina Earthly, MD FACS [facility] [phone] 05/29/2018 10:41 AM

## 2018-05-29 NOTE — Progress Notes (Signed)
Patient alert to self and place but confused to time. Patient is also emotional, very anxious  and stating "she does not want to die and wants to go home" since last PRN dose of dilaudid was given. Patient's confusion noticed when dilaudid was given although patient has had better pain relief with dilaudid.  Dr. Chestine Spore paged about patient's anxiety and situation. No new orders given at this moment. MD said to continue current pain regimen. Patient asking about eating and taking NG tube out.  Discussed with patient importance of keeping NG tube in place and that surgeon will be around to see her this morning. Patient has been mentating without complications except with slight confusion after dilaudid.

## 2018-05-29 NOTE — Progress Notes (Signed)
Physical Therapy Treatment Patient Details Name: Andrea Fleming MRN: [id number] DOB: [birth year] Today's Date: 05/29/2018    History of Present Illness Pt is a 58 y/o female with PAD who was experiencing gangrenous changes of her right fifth toe as well as short distance claudication. She is now s/p AORTA BIFEMORAL BYPASS GRAFT (Bilateral). Pt has a PMH including Allergy, Condyloma (1993), Depression, and Hyperlipidemia.    PT Comments    Pt continues to be confuse and disoriented, crying and asking to call friends so she can "say goodbye before she goes to heaven." Pt requires min A for bed mobility, minAx2 for transfers and short distance ambulation. Given pt's increased confusion, and decreased mobility, d/c plans should be changed to SNF level rehab. PT will continue to follow acutely.    Follow Up Recommendations  Supervision - Intermittent;SNF(if confusion clears)     Equipment Recommendations  Other (comment)(TBA)    Recommendations for Other Services       Precautions / Restrictions Precautions Precautions: Fall Restrictions Weight Bearing Restrictions: No    Mobility  Bed Mobility Overal bed mobility: Needs Assistance Bed Mobility: Supine to Sit     Supine to sit: Min assist;HOB elevated     General bed mobility comments: pt requires minA to pull against therapist to come to upright and for pad scoot of hips to Eob  Transfers Overall transfer level: Needs assistance Equipment used: Rolling walker (2 wheeled) Transfers: Sit to/from Stand Sit to Stand: Min assist;+2 safety/equipment;From elevated surface Stand pivot transfers: Min assist;From elevated surface       General transfer comment: minAx2 for steadying with standing, maximal verbal and tactile cuing for hand placment in power up and steadying with RW  Ambulation/Gait Ambulation/Gait assistance: Min assist;+2 safety/equipment Gait Distance (Feet): 6 Feet Assistive device: Rolling walker (2  wheeled) Gait Pattern/deviations: Step-through pattern;Decreased stride length Gait velocity: slowed Gait velocity interpretation: <1.31 ft/sec, indicative of household ambulator General Gait Details: ambulated 3 feet to window, to attempt to orient pt to hospital and [place]. stood at window for 5 minutes before ambulating to recliner         Balance Overall balance assessment: Needs assistance Sitting-balance support: Single extremity supported Sitting balance-Leahy Scale: Good Sitting balance - Comments: EOB able to demonstrate some leans to don socks   Standing balance support: Bilateral upper extremity supported Standing balance-Leahy Scale: Poor Standing balance comment: external assist for balance                            Cognition Arousal/Alertness: Awake/alert;Suspect due to medications(premedicated with morphine and dilaudid) Behavior During Therapy: [facility] for tasks assessed/performed(agreeable to get up, anxious and tearful at times) Overall Cognitive Status: Impaired/Different from baseline Area of Impairment: Orientation;Memory;Following commands;Safety/judgement;Problem solving                 Orientation Level: Disoriented to;Time;Place;Situation   Memory: Decreased recall of precautions;Decreased short-term memory Following Commands: Follows one step commands consistently Safety/Judgement: Decreased awareness of safety;Decreased awareness of deficits   Problem Solving: Requires verbal cues;Requires tactile cues;Slow processing General Comments: confusion, vaguely remember surgery for her foot, thinks she sees her friend in the parking lot, thinks she is dying         General Comments General comments (skin integrity, edema, etc.): max HR 135 with mobility, oxygen desaturation to 85% on 3L, increased cuing to keep [place] in nose, and for pursed lipped breathing for maximizing oxygenation  Pertinent Vitals/Pain Pain Assessment:  Faces Faces Pain Scale: Hurts little more Pain Location: BLE Pain Descriptors / Indicators: Crying;Grimacing;Sore Pain Intervention(s): Monitored during session;Repositioned;Limited activity within patient's tolerance           PT Goals (current goals can now be found in the care plan section) Acute Rehab PT Goals Patient Stated Goal: "have no more leg pain" PT Goal Formulation: With patient Time For Goal Achievement: 06/11/18 Potential to Achieve Goals: Good Progress towards PT goals: Not progressing toward goals - comment(limited by confusion )    Frequency    Min 3X/week      PT Plan Discharge plan needs to be updated    Co-evaluation PT/OT/SLP Co-Evaluation/Treatment: Yes Reason for Co-Treatment: Necessary to address cognition/behavior during functional activity PT goals addressed during session: Mobility/safety with mobility;Balance;Proper use of DME        AM-PAC PT "6 Clicks" Mobility   Outcome Measure  Help needed turning from your back to your side while in a flat bed without using bedrails?: A Little Help needed moving from lying on your back to sitting on the side of a flat bed without using bedrails?: A Little Help needed moving to and from a bed to a chair (including a wheelchair)?: A Little Help needed standing up from a chair using your arms (e.g., wheelchair or bedside chair)?: A Little Help needed to walk in hospital room?: A Lot Help needed climbing 3-5 steps with a railing? : A Lot 6 Click Score: 16    End of Session Equipment Utilized During Treatment: Gait belt;Oxygen Activity Tolerance: Patient limited by fatigue Patient left: in chair;with call bell/phone within reach;with chair alarm set Nurse Communication: Mobility status PT Visit Diagnosis: Unsteadiness on feet (R26.81);Muscle weakness (generalized) (M62.81);Pain Pain - part of body: (groin, lower abdomen)     Time: [id number] PT Time Calculation (min) (ACUTE ONLY): 26 min  Charges:   $Gait Training: 8-22 mins                     Aerica Rincon B. Beverely RisenVan Fleet PT, DPT Acute Rehabilitation Services Pager [phone] Office [phone]    Elon Alaslizabeth B Van Fleet 05/29/2018, 4:19 PM

## 2018-05-29 NOTE — Progress Notes (Signed)
Pt very tearful and anxious. When asked, pt does not know what is wrong or what is going on. Pt repeats that she does not know how to work the phone and becomes upset again. Pts boyfriend called and pt talked to him to help reorient the pt. Pt also states she takes Celexa and Xanax at home which she has not received this admission. MD called, orders noted. Will continue to follow.  Reynold Bowen, RN BSN 05/29/2018 9:07 PM

## 2018-05-29 NOTE — Progress Notes (Addendum)
Occupational Therapy Treatment Patient Details Name: Andrea Fleming MRN: [id number] DOB: 03/10/1960 Today's Date: 05/29/2018    History of present illness Pt is a 58 y/o female with PAD who was experiencing gangrenous changes of her right fifth toe as well as short distance claudication. She is now s/p AORTA BIFEMORAL BYPASS GRAFT (Bilateral). Pt has a PMH including Allergy, Condyloma (1993), Depression, and Hyperlipidemia.   OT comments  Pt continues with delirium, and confusion, but able to control it better this afternoon. Pt requires min A for bed mobility, minAx2 for transfers and short distance ambulation. Able to don socks EOB with set up, and grooming at min guard/set up in sitting and standing at [place]. Given pt's continued confusion, and decreased mobility, d/c plans should be changed to SNF level rehab. OT will continue to follow acutely.    Follow Up Recommendations  SNF;Supervision/Assistance - 24 hour    Equipment Recommendations  Other (comment)(defer to next venue of care)    Recommendations for Other Services      Precautions / Restrictions Precautions Precautions: Fall Restrictions Weight Bearing Restrictions: No       Mobility Bed Mobility Overal bed mobility: Needs Assistance Bed Mobility: Supine to Sit     Supine to sit: Min assist;HOB elevated     General bed mobility comments: pt requires minA to pull against therapist to come to upright and for pad scoot of hips to Eob  Transfers Overall transfer level: Needs assistance Equipment used: Rolling walker (2 wheeled) Transfers: Sit to/from Stand Sit to Stand: Min assist;+2 safety/equipment;From elevated surface Stand pivot transfers: Min assist;From elevated surface       General transfer comment: minAx2 for steadying with standing, maximal verbal and tactile cuing for hand placment in power up and steadying with RW    Balance Overall balance assessment: Needs assistance Sitting-balance support:  Single extremity supported Sitting balance-Leahy Scale: Good Sitting balance - Comments: EOB able to demonstrate some leans to don socks   Standing balance support: Bilateral upper extremity supported Standing balance-Leahy Scale: Poor Standing balance comment: external assist for balance                           ADL either performed or assessed with clinical judgement   ADL Overall ADL's : Needs assistance/impaired Eating/Feeding: NPO   Grooming: Wash/dry face;Set up;Sitting               Lower Body Dressing: Set up;Sitting/lateral leans Lower Body Dressing Details (indicate cue type and reason): to don socks Toilet Transfer: Minimal assistance;+2 for safety/equipment;Ambulation;RW Toilet Transfer Details (indicate cue type and reason): +2 for line management and Pt requires cues for safety with RW         Functional mobility during ADLs: Minimal assistance;+2 for safety/equipment;Rolling walker;Cueing for safety General ADL Comments: decreased cognition, labile     Vision       Perception     Praxis      Cognition Arousal/Alertness: Awake/alert;Suspect due to medications(premedicated with morphine and dilaudid) [facility] tasks assessed/performed(agreeable to get up, anxious and tearful at times) Overall Cognitive Status: Impaired/Different from baseline Area of Impairment: Orientation;Memory;Following commands;Safety/judgement;Problem solving                 Orientation Level: Disoriented to;Time;Place;Situation   Memory: Decreased recall of precautions;Decreased short-term memory Following Commands: Follows one step commands consistently Safety/Judgement: Decreased awareness of safety;Decreased awareness of deficits   Problem Solving: Requires verbal  cues;Requires tactile cues;Slow processing General Comments: confusion, vaguely remember surgery for her foot, thinks she sees her friend in the parking lot, thinks she  is dying        Exercises     Shoulder Instructions       General Comments max HR 135 with mobility, oxygen desaturation to 85% on 3L, increased cuing to keep [place] in nose, and for pursed lipped breathing for maximizing oxygenation     Pertinent Vitals/ Pain       Pain Assessment: Faces Faces Pain Scale: Hurts little more Pain Location: BLE Pain Descriptors / Indicators: Crying;Grimacing;Sore Pain Intervention(s): Limited activity within patient's tolerance;Monitored during session;Repositioned  Home Living                                          Prior Functioning/Environment              Frequency  Min 2X/week        Progress Toward Goals  OT Goals(current goals can now be found in the care plan section)  Progress towards OT goals: Progressing toward goals  Acute Rehab OT Goals Patient Stated Goal: "have no more leg pain" OT Goal Formulation: With patient Time For Goal Achievement: 06/11/18 Potential to Achieve Goals: Good  Plan Frequency remains appropriate;Discharge plan needs to be updated    Co-evaluation    PT/OT/SLP Co-Evaluation/Treatment: Yes Reason for Co-Treatment: Necessary to address cognition/behavior during functional activity PT goals addressed during session: Mobility/safety with mobility;Balance;Proper use of DME OT goals addressed during session: ADL's and self-care;Proper use of [employer] equipment and DME      AM-PAC OT "6 Clicks" Daily Activity     Outcome Measure   Help from another person eating meals?: Total(NPO) Help from another person taking care of personal grooming?: A Little Help from another person toileting, which includes using toliet, bedpan, or urinal?: A Lot Help from another person bathing (including washing, rinsing, drying)?: A Little Help from another person to put on and taking off regular upper body clothing?: A Little Help from another person to put on and taking off regular lower body  clothing?: A Little 6 Click Score: 15    End of Session Equipment Utilized During Treatment: Gait belt;Rolling walker;Oxygen(3L)  OT Visit Diagnosis: Other abnormalities of gait and mobility (R26.89);Other symptoms and signs involving cognitive function;Pain Pain - Right/Left: (BIlateral) Pain - part of body: Leg   Activity Tolerance Patient tolerated treatment well   Patient Left in chair;with call bell/phone within reach   Nurse Communication Mobility status        Time: 6440-3474 OT Time Calculation (min): 25 min  Charges: OT General Charges $OT Visit: 1 Visit OT Treatments $Therapeutic Activity: 8-22 mins  Sherryl Manges OTR/L Acute Rehabilitation Services Pager: [phone] Office: [phone]  Evern Bio Sigismund Cross 05/29/2018, 4:54 PM   Addendum: charges pulled through incorrectly. Fixed.

## 2018-05-30 ENCOUNTER — Inpatient Hospital Stay (HOSPITAL_COMMUNITY): Payer: 59

## 2018-05-30 LAB — TYPE AND SCREEN
ABO/RH(D): A POS
Antibody Screen: NEGATIVE
Unit division: 0
Unit division: 0

## 2018-05-30 LAB — BPAM RBC
Blood Product Expiration Date: 202005262359
Blood Product Expiration Date: 202005262359
Unit Type and Rh: 6200
Unit Type and Rh: 6200

## 2018-05-30 MED ORDER — BISACODYL 10 MG RE SUPP
10.0000 mg | Freq: Every day | RECTAL | Status: DC | PRN
  Administered 2018-05-31: 10 mg via RECTAL
  Filled 2018-05-30: qty 1

## 2018-05-30 MED ORDER — OXYCODONE-ACETAMINOPHEN 5-325 MG PO TABS
1.0000 | ORAL_TABLET | ORAL | Status: DC | PRN
  Administered 2018-05-30 – 2018-06-01 (×6): 2 via ORAL
  Filled 2018-05-30: qty 2
  Filled 2018-05-30: qty 1
  Filled 2018-05-30 (×3): qty 2
  Filled 2018-05-30: qty 1
  Filled 2018-05-30 (×3): qty 2

## 2018-05-30 MED ORDER — CITALOPRAM HYDROBROMIDE 20 MG PO TABS
10.0000 mg | ORAL_TABLET | Freq: Every day | ORAL | Status: DC
  Administered 2018-05-30 – 2018-06-01 (×3): 10 mg via ORAL
  Filled 2018-05-30 (×3): qty 1

## 2018-05-30 NOTE — Progress Notes (Signed)
Pt reporting difficulty coughing like she feels she needs to.  IS given to patient with instructions for its use.  Will continue to monitor.

## 2018-05-30 NOTE — Progress Notes (Addendum)
   VASCULAR SURGERY ASSESSMENT & PLAN:   POD 3: AORTOFEMORAL BYPASS: This patient underwent an aortofemoral bypass graft for occlusive disease because of gangrene of the right fifth toe.  She has some swelling under her incision and I am concerned she might have an abdominal dehiscence.  There is no drainage from the incision but this is sealed with Dermabond.  I have recommended we proceed with a CT of the abdomen and pelvis to assess for dehiscence of her fascia.  If this is not the case then we can slowly resume her diet.  CARDIAC: Hemodynamically stable.  RENAL: Her proximal clamp was between the 2 renals.  Her creatinine is down slightly to 1.2,  she was 1.4 yesterday.  GI NUTRITION: She is n.p.o. currently.  DVT PROPHYLAXIS: She is on subcu heparin.   SUBJECTIVE:   No BM yet.  Not much appetite yet.  No nausea.  Minimal flatus.  Abdomen somewhat tender.  PHYSICAL EXAM:   Vitals:   05/30/18 0001 05/30/18 0015 05/30/18 0218 05/30/18 0347  BP:  104/77  125/67  Pulse: (!) 101 94 91 98  Resp: (!) 22 (!) 25 (!) 23   Temp:  98.8 F (37.1 C)    TempSrc:  Oral    SpO2: 96% 98% 90% 95%  Weight:      Height:       Patient has some swelling beneath the incision.  No drainage from the incision but this is sealed with Dermabond Lungs are clear. Abdomen hypoactive bowel sounds. No significant leg swelling. Palpable dorsalis pedis pulses Right fifth toe unchanged.  LABS:   Lab Results  Component Value Date   WBC 19.2 (H) 05/29/2018   HGB 10.7 (L) 05/29/2018   HCT 31.4 (L) 05/29/2018   MCV 98.4 05/29/2018   PLT 210 05/29/2018   Lab Results  Component Value Date   CREATININE 1.21 (H) 05/29/2018   Lab Results  Component Value Date   INR 1.2 05/27/2018    PROBLEM LIST:    Active Problems:   Aortoiliac occlusive disease (HCC)   CURRENT MEDS:   . Chlorhexidine Gluconate Cloth  6 each Topical Daily  . docusate sodium  100 mg Oral Daily  . heparin  5,000 Units  Subcutaneous Q8H  . mouth rinse  15 mL Mouth Rinse BID  . metoprolol tartrate  2.5 mg Intravenous Q6H  . pantoprazole  40 mg Oral Daily  . pantoprazole (PROTONIX) IV  40 mg Intravenous QHS  . sodium chloride flush  10-40 mL Intracatheter Q12H    Waverly Ferrari Beeper: [phone] Office: [phone] 05/30/2018

## 2018-05-30 NOTE — Progress Notes (Signed)
Pt encouraged to get out of bed and walk or get into the chair.  Pt refused stating she was in too much pain and said she would get up and walk tomorrow.  Will continue to monitor.

## 2018-05-31 LAB — CBC WITH DIFFERENTIAL/PLATELET
Abs Immature Granulocytes: 0.06 10*3/uL (ref 0.00–0.07)
Basophils Absolute: 0.1 10*3/uL (ref 0.0–0.1)
Basophils Relative: 0 %
Eosinophils Absolute: 0.2 10*3/uL (ref 0.0–0.5)
Eosinophils Relative: 1 %
HCT: 29.4 % — ABNORMAL LOW (ref 36.0–46.0)
Hemoglobin: 10 g/dL — ABNORMAL LOW (ref 12.0–15.0)
Immature Granulocytes: 1 %
Lymphocytes Relative: 21 %
Lymphs Abs: 2.6 10*3/uL (ref 0.7–4.0)
MCH: 32.8 pg (ref 26.0–34.0)
MCHC: 34 g/dL (ref 30.0–36.0)
MCV: 96.4 fL (ref 80.0–100.0)
Monocytes Absolute: 0.8 10*3/uL (ref 0.1–1.0)
Monocytes Relative: 7 %
Neutro Abs: 8.5 10*3/uL — ABNORMAL HIGH (ref 1.7–7.7)
Neutrophils Relative %: 70 %
Platelets: 275 10*3/uL (ref 150–400)
RBC: 3.05 MIL/uL — ABNORMAL LOW (ref 3.87–5.11)
RDW: 13.5 % (ref 11.5–15.5)
WBC: 12.2 10*3/uL — ABNORMAL HIGH (ref 4.0–10.5)
nRBC: 0 % (ref 0.0–0.2)

## 2018-05-31 NOTE — Progress Notes (Signed)
   VASCULAR SURGERY ASSESSMENT & PLAN:   POD 4: AORTOFEMORAL BYPASS: This patient underwent an aortofemoral bypass graft for occlusive disease because of gangrene of the right fifth toe.  He has palpable dorsalis pedis pulses.  She was having significant abdominal pain yesterday and also some swelling in the superior aspect of her incision.  I was concerned that she could potentially have an underlying dehiscence of the fascia.  CT scan did not show any evidence of this.  RENAL:  She has excellent urine output  GI NUTRITION: I started her on a full liquid diet yesterday.  Will advance to a regular diet.  DVT PROPHYLAXIS: She is on subcu heparin.  ELEVATED WBC: Her white blood cell count was 19.2 on 05/29/2018.  I will order a follow-up CBC.  She is afebrile.  SUBJECTIVE:   No specific complaints this morning.  Tolerating full liquid diet.  No nausea.  PHYSICAL EXAM:   Vitals:   05/30/18 1955 05/30/18 2316 05/31/18 0009 05/31/18 0405  BP: 104/64 136/78  126/75  Pulse:  94 85 87  Resp: 18     Temp: 98.1 F (36.7 C)  98.6 F (37 C) 97.9 F (36.6 C)  TempSrc: Oral  Oral Oral  SpO2: 96%  91% 99%  Weight:      Height:       ABDOMEN: Good bowel sounds LUNGS: Good air exchange bilaterally VASCULAR: Palpable dorsalis pedis pulses bilaterally. 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  LABS:   Lab Results  Component Value Date   WBC 19.2 (H) 05/29/2018   HGB 10.7 (L) 05/29/2018   HCT 31.4 (L) 05/29/2018   MCV 98.4 05/29/2018   PLT 210 05/29/2018   Lab Results  Component Value Date   CREATININE 1.21 (H) 05/29/2018   Lab Results  Component Value Date   INR 1.2 05/27/2018    PROBLEM LIST:    Active Problems:   Aortoiliac occlusive disease (HCC)   CURRENT MEDS:   . Chlorhexidine Gluconate Cloth  6 each Topical Daily  . citalopram  10 mg Oral Daily  . docusate sodium  100 mg Oral Daily  . heparin  5,000 Units Subcutaneous Q8H  . mouth  rinse  15 mL Mouth Rinse BID  . metoprolol tartrate  2.5 mg Intravenous Q6H  . pantoprazole  40 mg Oral Daily  . pantoprazole (PROTONIX) IV  40 mg Intravenous QHS  . sodium chloride flush  10-40 mL Intracatheter Q12H    Waverly Ferrari Beeper: [phone] Office: [phone] 05/31/2018

## 2018-05-31 NOTE — Progress Notes (Signed)
Patient walking from bed to [facility] with minimal assistance.  Offered walk in hallway, but pt refused at current time.

## 2018-05-31 NOTE — Progress Notes (Signed)
Foley catheter removed without difficulty per pt request, after checking with Md. Will continue to monitor.

## 2018-05-31 NOTE — Progress Notes (Signed)
Late entry: Nurse recommended patient to get off bed and walk for a while last night around 2200 but patient refused and suggested that she will do it this morning,  Area around incision site is red, no drainage,  noted mild swelling of the abdomen, no bowel movement this shift, patient said she has been passing a lot of gases, call light within reach, will continue to monitor.

## 2018-06-01 MED ORDER — OXYCODONE-ACETAMINOPHEN 5-325 MG PO TABS: 1.0000 | ORAL_TABLET | Freq: Four times a day (QID) | ORAL | 0 refills | Status: DC | PRN

## 2018-06-01 NOTE — Discharge Summary (Signed)
AAA Discharge Summary    Andrea Fleming September 07, 1960 58 y.o. female  [id number]  Admission Date: 05/27/2018  Discharge Date: 06/01/2018  Physician: Sherren Kerns, MD  Admission Diagnosis: PERIPHERAL ARTERY DISEASE   HPI:   This is a 57 y.o. female who returns for follow-up today.  She was seen 1 week ago for early gangrenous changes of her right fifth toe as well as short distance claudication.  Over the last week she had a CT angiogram of the abdomen and pelvis with runoff as well as a cardiac stress test.  Her cardiac stress test was negative.  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  There was no significant celiac or renal artery occlusive disease.  She had an anterior left renal vein.  She had single renal arteries bilaterally.  He states her symptoms are unchanged.  She still has pain in the right foot requiring tramadol.  She has very short distance claudication.  She is now on a statin and aspirin.  Review of systems: She denies shortness of breath.  She denies chest pain.  Hospital Course:  The patient was admitted to the hospital and taken to the operating room on 05/27/2018 and underwent: Aortobifemoral bypass grafting    Findings: 14 x 7 mm Dacron graft end-to-end infrarenal abdominal aorta  The pt tolerated the procedure well and was transported to the PACU in good condition.   By POD 1, pt with decreased uop and received IVF bolus.  NGT was left in.  Pt was seen by PT and HHPT was recommended.   By POD 2, she had minimal NGT output and it was removed.  IVF continued.  Later that day, there was some question about fullness on her abdominal wall.  No evidence of erythema to suggest hematoma or fascial dehiscence.  Monitor.  She was started on liquid diet.    POD 3, creatinine was trending downward.  Pt remains npo  as she has no BM yet.  MD was concerned about dehiscence and CT scan was ordered.    POD 4, CT scan did not show any evidence of dehiscence.  Her uop was excellent.  She was advanced to a regular diet.    POD 5, she did have some pain in her abdominal incision but percocet helped.  She had a BM.  Tolerating diet.  Ambulating well.  Groin wound care discussed with pt.  Continue dry gauze to right 5th toe.  Discharged home.   The remainder of the hospital course consisted of increasing mobilization and increasing intake of solids without difficulty.  CBC    Component Value Date/Time   WBC 12.2 (H) 05/31/2018 0915   RBC 3.05 (L) 05/31/2018 0915   HGB 10.0 (L) 05/31/2018 0915   HCT 29.4 (L) 05/31/2018 0915   PLT 275 05/31/2018 0915   MCV 96.4 05/31/2018 0915   MCH 32.8 05/31/2018 0915   MCHC 34.0 05/31/2018 0915   RDW 13.5 05/31/2018 0915   LYMPHSABS 2.6 05/31/2018 0915   MONOABS 0.8 05/31/2018 0915   EOSABS 0.2 05/31/2018 0915   BASOSABS 0.1 05/31/2018 0915    BMET    Component Value Date/Time   NA 135 05/29/2018 0455   K 4.1 05/29/2018 0455   CL 103 05/29/2018 0455   CO2 22 05/29/2018 0455   GLUCOSE 137 (H) 05/29/2018 0455   BUN 18  05/29/2018 0455   CREATININE 1.21 (H) 05/29/2018 0455   CALCIUM 8.3 (L) 05/29/2018 0455   GFRNONAA 50 (L) 05/29/2018 0455   GFRAA 58 (L) 05/29/2018 0455       Discharge Diagnosis:  PERIPHERAL ARTERY DISEASE  Secondary Diagnosis: Patient Active Problem List   Diagnosis Date Noted  . Aortoiliac occlusive disease (HCC) 05/27/2018  . Cellulitis and abscess of head 11/17/2015  . Depression 04/06/2010  . Seasonal allergies 04/06/2010   Past Medical History:  Diagnosis Date  . Allergy   . Condyloma 1993  . Depression   . Hyperlipidemia      Allergies as of 06/01/2018      Reactions   Sulfa Antibiotics Nausea And Vomiting   GI upset   Sulfamethoxazole Nausea And Vomiting      Medication List    STOP taking these medications    meloxicam 15 MG tablet Commonly known as:  MOBIC   pentoxifylline 400 MG CR tablet Commonly known as:  TRENTAL   traMADol 50 MG tablet Commonly known as:  ULTRAM     TAKE these medications   ALPRAZolam 0.5 MG tablet Commonly known as:  XANAX Take 0.5 mg by mouth at bedtime as needed for anxiety.   aspirin EC 325 MG tablet Take 1 tablet (325 mg total) by mouth daily.   cetirizine 10 MG tablet Commonly known as:  ZYRTEC Take 10 mg by mouth daily.   citalopram 10 MG tablet Commonly known as:  CELEXA Take 10 mg by mouth daily.   lidocaine 4 % cream Commonly known as:  LMX Apply 1 application topically 2 (two) times daily as needed (foot pain).   oxyCODONE-acetaminophen 5-325 MG tablet Commonly known as:  PERCOCET/ROXICET Take 1 tablet by mouth every 6 (six) hours as needed for moderate pain.   povidone-iodine 10 % ointment Commonly known as:  BETADINE Apply 1 application topically as needed for wound care.   simvastatin 10 MG tablet Commonly known as:  ZOCOR Take 1 tablet (10 mg total) by mouth at bedtime.       Instructions:  [facility] Discharge Instructions   Open Aortic Surgery  Please refer to the following instructions for your post-procedure care. Your surgeon or Physician Assistant will discuss any changes with you.  Activity  Avoid lifting more than eight pounds (a gallon of milk) until after your first post-operative visit. You are encouraged to walk as much as you can. You can slowly return to normal activities but must avoid strenuous activity and heavy lifting until your doctor tells you it's okay. Heavy lifting can hurt the incision and cause a hernia. Avoid activities such as vacuuming or swinging a golf club. It is normal to feel tired for several weeks after your surgery. Do not drive until your doctor gives the okay and you are no longer taking prescription pain medications. It is also normal to have difficulty  with sleep habits, eating and bowl movements after surgery. These will go away with time.  Bathing/Showering  Shower daily after you go home. Do not soak in a bathtub, hot tub, or swim until the incision heals.  Incision Care  Shower every day. Clean your incision with mild soap and water. Pat the area dry with a clean towel. You do not need a bandage unless otherwise instructed. Do not apply any ointments or creams to your incision. You may have skin glue on your incision. Do not peel it off. It will come off on  its own in about one week. If you have staples or sutures along your incision, they will be removed at your post op appointment.  If you have groin incisions, wash the groin wounds with soap and water daily and pat dry. (No tub bath-only shower)  Then put a dry gauze or washcloth in the groin to keep this area dry to help prevent wound infection.  Do this daily and as needed.  Do not use Vaseline or neosporin on your incisions.  Only use soap and water on your incisions and then protect and keep dry.  Diet  Resume your normal diet. There are no special food restriction following this procedure. A low fat/low cholesterol diet is recommended for all patients with vascular disease. After your aortic surgery, it's normal to feel full faster than usual and to not feel as hungry as you normally would. You will probably lose weight initially following your surgery. It's best to eat small, frequent meals over the course of the day. Call the office if you find that you are unable to eat even small meals.   In order to heal from your surgery, it is CRITICAL to get adequate nutrition. Your body requires vitamins, minerals, and protein. Vegetables are the best source of vitamins and minerals.  If you have pain, you may take over-the-counter pain reliever such as acetaminophen (Tylenol). If you were prescribed a stronger pain medication, please be aware these medication can cause nausea and  constipation. Prevent nausea by taking the medication with a snack or meal. Avoid constipation by drinking plenty of fluids and eating foods with a high amount of fiber, such as fruits, vegetables and grains. Take 100mg  of the over-the-counter stool softener Colace twice a day as needed to help with constipation. A laxative, such as Milk of Magnesia, may be recommended for you at this time. Do not take a laxative unless your surgeon or P.A. tells you it's OK.  Do not take Tylenol if you are taking stronger pain medications (such as Percocet).  Follow Up  Our office will schedule a follow up appointment 2-3 weeks after discharge.  Please call us immediately for any of the following conditions    .     Severe or worsening pain in your legs or feet or in your abdomen back or chest. . Increased pain, redness drainage (pus) from your incision site. . Increased abdominal pain, bloating, nausea, vomiting, or persistent diarrhea. . Fever of 101 degrees or higher. . Swelling in your leg (s). .  Reduce your risk of vascular disease  . Stop smoking. If you would like help, call [facility] at 1-800-QUIT-NOW ((250)368-33651-(701)779-3485) or [place] at 812-646-5175(787)187-2917. . Manage your cholesterol . Maintain a desired weight . Control your diabetes . Keep your blood pressure down .  If you have any questions please call the office at [phone].   Prescriptions given: 1.  Roxicet #30 No Refill  Disposition: home  Patient's condition: is Good  Follow up: 1. Dr. Darrick PennaFields in 2 weeks   Doreatha MassedSamantha Rozalyn Osland, PA-C [facility] [phone] 06/01/2018  7:42 AM   - For [facility]QI Registry use -  Post-op:  Time to Extubation: [x]  In OR, [ ]  < 12 hrs, [ ]  12-24 hrs, [ ]  >=24 hrs Vasopressors Req. Post-op: No ICU Stay: 2 day in ICU Transfusion: No   If yes, n/a units given MI: No, [ ]  Troponin only, [ ]  EKG or Clinical New Arrhythmia: No  Complications: CHF: No Resp failure:  No,  Pneumonia,   Ventilator Chg in renal function: No,  Inc. Cr > 0.5,  Temp. Dialysis,  Permanent dialysis Leg ischemia: No, no Surgery needed,  Yes, Surgery needed,  Amputation Bowel ischemia: No,  Medical Rx,  Surgical Rx Wound complication: No,  Superficial separation/infection,  Return to OR Return to OR: No  Return to OR for bleeding: No Stroke: No,  Minor,  Major  Discharge medications: Statin use:  Yes  ASA use:  Yes   Plavix use:  No  Beta blocker use:  No  ACEI use:  No ARB use:  No CCB use:  No Coumadin use:  No

## 2018-06-01 NOTE — Progress Notes (Signed)
Received call from RN after pt had discharged that the pharmacy that the pain rx was routed to is closed and I was asked to resend to [facility] on [address].   I spoke with the pharmacist and resent the rx to that pharmacy and called and left a message with the [facility] in [place] to disregard that rx.  Doreatha Massed 06/01/2018 12:57 PM

## 2018-06-01 NOTE — Progress Notes (Signed)
Physical Therapy Treatment Patient Details Name: Andrea Fleming MRN: [id number] DOB: 1960/04/26 Today's Date: 06/01/2018    History of Present Illness Pt is a 58 y/o female with PAD who was experiencing gangrenous changes of her right fifth toe as well as short distance claudication. She is now s/p AORTA BIFEMORAL BYPASS GRAFT (Bilateral). Pt has a PMH including Allergy, Condyloma (1993), Depression, and Hyperlipidemia.    PT Comments    Pt's confusion had cleared and she is able to participate appropriately in therapy today. Pt is mod I for bed mobility and supervision for transfers, ambulation and ascent/descent of 2 steps. Pt only deficit is some mild instability but does not have and LoB and anticipate will continue to improve as endurance improves. Pt does not require any additional PT services at d/c. Pt hopeful for d/c this afternoon.    Follow Up Recommendations  Supervision - Intermittent;No PT follow up     Equipment Recommendations  None recommended by PT       Precautions / Restrictions Precautions Precautions: Fall Restrictions Weight Bearing Restrictions: No    Mobility  Bed Mobility Overal bed mobility: Needs Assistance Bed Mobility: Supine to Sit     Supine to sit: Modified independent (Device/Increase time)     General bed mobility comments: HoB elevated and use of handrails to come to EoB  Transfers Overall transfer level: Needs assistance Equipment used: Rolling walker (2 wheeled) Transfers: Sit to/from Stand Sit to Stand: Supervision         General transfer comment: supervision for safety  Ambulation/Gait Ambulation/Gait assistance: Supervision Gait Distance (Feet): 250 Feet Assistive device: None Gait Pattern/deviations: WFL(Within Functional Limits) Gait velocity: slowed Gait velocity interpretation: 1.31 - 2.62 ft/sec, indicative of limited community ambulator General Gait Details: slow, steady ambulation, no LoB and only deficit mild  drifting with gail   Stairs Stairs: Yes Stairs assistance: Supervision Stair Management: One rail Left;Forwards;Alternating pattern Number of Stairs: 2 General stair comments: steady, ascent/descent of 2 steps       Balance Overall balance assessment: Mild deficits observed, not formally tested                                          Cognition Arousal/Alertness: Awake/alert;Suspect due to medications(premedicated with morphine and dilaudid) Behavior During Therapy: [facility]FL for tasks assessed/performed(agreeable to get up, anxious and tearful at times) Overall Cognitive Status: Within Functional Limits for tasks assessed                                 General Comments: confusion had cleared and apologetic for behavior last week      Exercises      General Comments General comments (skin integrity, edema, etc.): Able to dress gangrene toe and don sock, VSS      Pertinent Vitals/Pain Pain Assessment: Faces Faces Pain Scale: Hurts a little bit Pain Location: BLE Pain Descriptors / Indicators: Grimacing;Sore Pain Intervention(s): Limited activity within patient's tolerance;Monitored during session;Repositioned           PT Goals (current goals can now be found in the care plan section) Acute Rehab PT Goals Patient Stated Goal: "have no more leg pain" PT Goal Formulation: With patient Time For Goal Achievement: 06/11/18 Potential to Achieve Goals: Good Progress towards PT goals: Progressing toward goals    Frequency  Min 3X/week      PT Plan Discharge plan needs to be updated    Co-evaluation PT/OT/SLP Co-Evaluation/Treatment: Yes Reason for Co-Treatment: To address functional/ADL transfers PT goals addressed during session: Mobility/safety with mobility        AM-PAC PT "6 Clicks" Mobility   Outcome Measure  Help needed turning from your back to your side while in a flat bed without using bedrails?: None Help needed  moving from lying on your back to sitting on the side of a flat bed without using bedrails?: None Help needed moving to and from a bed to a chair (including a wheelchair)?: None Help needed standing up from a chair using your arms (e.g., wheelchair or bedside chair)?: None Help needed to walk in hospital room?: None Help needed climbing 3-5 steps with a railing? : None 6 Click Score: 24    End of Session Equipment Utilized During Treatment: Gait belt Activity Tolerance: Patient tolerated treatment well Patient left: in chair;with call bell/phone within reach Nurse Communication: Mobility status PT Visit Diagnosis: Unsteadiness on feet (R26.81);Muscle weakness (generalized) (M62.81);Pain Pain - part of body: (groin, lower abdomen)     Time: 4097-3532 PT Time Calculation (min) (ACUTE ONLY): 24 min  Charges:  $Gait Training: 8-22 mins                     Laronica Bhagat B. Beverely Risen PT, DPT Acute Rehabilitation Services Pager [phone] Office [phone]    Elon Alas Fleet 06/01/2018, 10:42 AM

## 2018-06-01 NOTE — Progress Notes (Addendum)
  AAA Progress Note    06/01/2018 7:23 AM 5 Days Post-Op  Subjective:  Says she has some pain in her abdomen around 4am, percocet helps.  Says she had a big BM.  Denies nausea  Afebrile HR 80's-100's NSR 130's-140's systolic 96% RA  Vitals:   06/01/18 0616 06/01/18 0617  BP:    Pulse:    Resp: (!) 23 (!) 23  Temp:    SpO2:      Physical Exam: Cardiac:  regular Lungs:  Non labored Abdomen:  Distended but soft; +BS; +BM Incisions:  Midline and bilateral groin incisions are clean and dry Extremities:  Easily palpable DP pulses bilaterally   CBC    Component Value Date/Time   WBC 12.2 (H) 05/31/2018 0915   RBC 3.05 (L) 05/31/2018 0915   HGB 10.0 (L) 05/31/2018 0915   HCT 29.4 (L) 05/31/2018 0915   PLT 275 05/31/2018 0915   MCV 96.4 05/31/2018 0915   MCH 32.8 05/31/2018 0915   MCHC 34.0 05/31/2018 0915   RDW 13.5 05/31/2018 0915   LYMPHSABS 2.6 05/31/2018 0915   MONOABS 0.8 05/31/2018 0915   EOSABS 0.2 05/31/2018 0915   BASOSABS 0.1 05/31/2018 0915    BMET    Component Value Date/Time   NA 135 05/29/2018 0455   K 4.1 05/29/2018 0455   CL 103 05/29/2018 0455   CO2 22 05/29/2018 0455   GLUCOSE 137 (H) 05/29/2018 0455   BUN 18 05/29/2018 0455   CREATININE 1.21 (H) 05/29/2018 0455   CALCIUM 8.3 (L) 05/29/2018 0455   GFRNONAA 50 (L) 05/29/2018 0455   GFRAA 58 (L) 05/29/2018 0455    INR    Component Value Date/Time   INR 1.2 05/27/2018 1413     Intake/Output Summary (Last 24 hours) at 06/01/2018 0723 Last data filed at 06/01/2018 0600 Gross per 24 hour  Intake 5669.99 ml  Output 700 ml  Net 4969.99 ml     Assessment/Plan:  57 y.o. female is s/p  Aortobifemoral bypass 5 Days Post-Op  Vascular-pt with easily palpable DP pulses bilaterally; gangrene right 5th toe-appears to be improving; dry gauze to protect 5th toe.  Cardiac:  Regular Renal:  Creatinine improved yesterday GI:  No N/V; +BM; +flatus; tolerating diet Pulmonary:  Non labored;  discussed with pt to continue using IS even when she is discharged. Incisions:  All healing nicely.  Discussed groin wound care with pt.  Heme/ID:  Leukocytosis improved from yesterday to 12k from 19k; acute blood loss anemia-tolerating   Home today with pain medication.  Discussed with pt to continue walking when she gets home.  No heavy lifting or driving until returning to see Dr. Darrick Penna.  Continue to keep groin incisions dry unless in shower.  Dry gauze to right 5th toe.     Doreatha Massed, PA-C [facility] [phone] 06/01/2018 7:23 AM  I have interviewed the patient and examined the patient. I agree with the findings by the PA. Her bowels are working.  Her pain is well controlled and she has been ambulating in the halls.  Home today.  Follow-up with Dr. Darrick Penna.   Cari Caraway, MD [phone]

## 2018-06-01 NOTE — Progress Notes (Signed)
CSW noting per chart review that patient being discharged home. Last therapy evaluations were recommending SNF, but patient has since been up and ambulating in the halls. CSW sent message to PT and OT assigned to patient today to ask about appropriateness for home discharge and any home needs at this time.  Blenda Nicely, [place] Clinical Social Worker [phone]

## 2018-06-01 NOTE — Progress Notes (Signed)
Occupational Therapy Treatment Patient Details Name: Andrea HughsKathy G Hanshaw MRN: [id number] DOB: 08-29-1960 Today's Date: 06/01/2018    History of present illness Pt is a 58 y/o female with PAD who was experiencing gangrenous changes of her right fifth toe as well as short distance claudication. She is now s/p AORTA BIFEMORAL BYPASS GRAFT (Bilateral). Pt has a PMH including Allergy, Condyloma (1993), Depression, and Hyperlipidemia.   OT comments  Pt's confusion has cleared and Pt apologized for "acting crazy". Pt overall was mod I for ADL and transfers, no DME needed throughout session. OT education complete, and Pt plan of care updated as Pt does not need any OT follow up or DME post-acute. Thank you for the opportunity to serve this patient.    Follow Up Recommendations  No OT follow up;Supervision - Intermittent    Equipment Recommendations  None recommended by OT    Recommendations for Other Services      Precautions / Restrictions Precautions Precautions: Fall Restrictions Weight Bearing Restrictions: No       Mobility Bed Mobility Overal bed mobility: Needs Assistance Bed Mobility: Supine to Sit     Supine to sit: Modified independent (Device/Increase time)     General bed mobility comments: HoB elevated and use of handrails to come to EoB  Transfers Overall transfer level: Needs assistance Equipment used: Rolling walker (2 wheeled) Transfers: Sit to/from Stand Sit to Stand: Supervision         General transfer comment: supervision for safety    Balance Overall balance assessment: Mild deficits observed, not formally tested                                         ADL either performed or assessed with clinical judgement   ADL Overall ADL's : Modified independent                                       General ADL Comments: Pt able to complete transfers, LB dressing at EOB, no questions or concerns. has assist for IADL from  boyfriend and ex-sister in Educational psychologistlaw     Vision       Perception     Praxis      Cognition Arousal/Alertness: Awake/alert;Suspect due to medications Behavior [facility] for tasks assessed/performed Overall Cognitive Status: Within Functional Limits for tasks assessed                                 General Comments: confusion had cleared and apologetic for behavior last week        Exercises     Shoulder Instructions       General Comments Able to dress gangrene toe and don sock, VSS    Pertinent Vitals/ Pain       Pain Assessment: Faces Faces Pain Scale: Hurts a little bit Pain Location: BLE Pain Descriptors / Indicators: Grimacing;Sore Pain Intervention(s): Monitored during session;Repositioned  Home Living                                          Prior Functioning/Environment  Frequency  Min 2X/week        Progress Toward Goals  OT Goals(current goals can now be found in the care plan section)  Progress towards OT goals: Progressing toward goals  Acute Rehab OT Goals Patient Stated Goal: get home to my dog OT Goal Formulation: With patient Time For Goal Achievement: 06/11/18 Potential to Achieve Goals: Good  Plan Frequency remains appropriate;Discharge plan needs to be updated    Co-evaluation    PT/OT/SLP Co-Evaluation/Treatment: Yes Reason for Co-Treatment: To address functional/ADL transfers PT goals addressed during session: Mobility/safety with mobility OT goals addressed during session: ADL's and self-care      AM-PAC OT "6 Clicks" Daily Activity     Outcome Measure   Help from another person eating meals?: None Help from another person taking care of personal grooming?: None Help from another person toileting, which includes using toliet, bedpan, or urinal?: None Help from another person bathing (including washing, rinsing, drying)?: None Help from another person to put on and  taking off regular upper body clothing?: None Help from another person to put on and taking off regular lower body clothing?: None 6 Click Score: 24    End of Session Equipment Utilized During Treatment: Gait belt  OT Visit Diagnosis: Other abnormalities of gait and mobility (R26.89);Pain Pain - Right/Left: Right Pain - part of body: Leg   Activity Tolerance Patient tolerated treatment well   Patient Left in chair;with call bell/phone within reach   Nurse Communication Mobility status        Time: 0350-0938 OT Time Calculation (min): 32 min  Charges: OT General Charges $OT Visit: 1 Visit OT Treatments $Self Care/Home Management : 8-22 mins  Sherryl Manges OTR/L Acute Rehabilitation Services Pager: [phone] Office: [phone]   Evern Bio Tamala Manzer 06/01/2018, 11:00 AM

## 2018-06-02 MED FILL — Sodium Chloride IV Soln 0.9%: INTRAVENOUS | Qty: 1000 | Status: AC

## 2018-06-02 MED FILL — Heparin Sodium (Porcine) Inj 1000 Unit/ML: INTRAMUSCULAR | Qty: 30 | Status: AC

## 2018-06-03 ENCOUNTER — Telehealth: Payer: Self-pay

## 2018-06-03 NOTE — Telephone Encounter (Signed)
Patient called and said that she was having a lot of issues where her catheter was. She said that it feels like there is a ripping feeling. She said that it is like severe razor burn.    Called and spoke with patient and advised her that there was a possibility that she may have a UTI and suggested she call her PCP to see if she can get a urine test done. Encouraged her to drink plenty of water as well.    She said that the nurse at the hospital also told her to use some Lotrisone cream.   Julious Payer, CMA

## 2018-06-04 ENCOUNTER — Other Ambulatory Visit: Payer: Self-pay | Admitting: Vascular Surgery

## 2018-06-04 MED ORDER — BISACODYL 5 MG PO TBEC: 5.0000 mg | DELAYED_RELEASE_TABLET | Freq: Every day | ORAL | 0 refills | Status: DC | PRN

## 2018-06-04 MED ORDER — GLYCERIN (ADULT) 2 G RE SUPP: 1.0000 | RECTAL | 0 refills | Status: DC | PRN

## 2018-06-06 ENCOUNTER — Other Ambulatory Visit: Payer: Self-pay | Admitting: Vascular Surgery

## 2018-06-06 MED ORDER — TRAMADOL HCL 50 MG PO TABS: 50.0000 mg | ORAL_TABLET | Freq: Four times a day (QID) | ORAL | 0 refills | Status: DC | PRN

## 2018-06-06 NOTE — Progress Notes (Signed)
Having severe constipation on Percocet but still a lot of midline incisional abdominal pain.  Finally had bowel movement with suppository last night.  No nausea, vomiting, fevers. Etc.  Will prescribe tramadol to try and wean off percocet.  Cephus Shelling, MD [facility] Office: [phone] Pager: [phone]  Cephus Shelling

## 2018-06-07 ENCOUNTER — Other Ambulatory Visit: Payer: Self-pay | Admitting: Vascular Surgery

## 2018-06-11 ENCOUNTER — Telehealth: Payer: Self-pay

## 2018-06-11 NOTE — Telephone Encounter (Signed)
Pt called and said that she has been having issues with constipation and she called and Dr Randie Heinz phoned her in something to help because she was not able to use the suppository without help. She said that she has been taking it and now she no longer has the issue but she was given Tramadol as well By Dr Chestine Spore and she is not sure what to take.   Called pt and told her to take the Tramadol rather than the Oxycodone but told her to try to manage her pain with otc meds and rest before depending on the Tramadol. She agreed that she would do that.  Advised her if she is now having diarrhea she could hold off on the oral medication that was given to help with this.   Julious Payer, CMA

## 2018-06-12 ENCOUNTER — Other Ambulatory Visit: Payer: Self-pay | Admitting: Vascular Surgery

## 2018-06-16 ENCOUNTER — Telehealth: Payer: Self-pay | Admitting: *Deleted

## 2018-06-16 NOTE — Telephone Encounter (Signed)
Spoke with patient's significant other about solutions for continued constipation. Suggested Fleet enema and then  Miralax daily/plenty of water  until resolved. Tylenol for pain due to severe constipation with narcotics. He states patient has long history with mental health issues and believes the surgery has caused exacerbation of her mental problems. He state PCP is no help. I gave him the number and contact name for self referral to [facility] in [place] and the crises hotline number. Encourage him to be available by phone for 06/18/2018 appointment with Dr. Oneida Alar.

## 2018-06-17 ENCOUNTER — Telehealth (HOSPITAL_COMMUNITY): Payer: Self-pay | Admitting: Rehabilitation

## 2018-06-17 NOTE — Telephone Encounter (Signed)

## 2018-06-18 ENCOUNTER — Ambulatory Visit (INDEPENDENT_AMBULATORY_CARE_PROVIDER_SITE_OTHER): Payer: Self-pay | Admitting: Vascular Surgery

## 2018-06-18 ENCOUNTER — Other Ambulatory Visit: Payer: Self-pay

## 2018-06-18 ENCOUNTER — Encounter: Payer: Self-pay | Admitting: Vascular Surgery

## 2018-06-18 ENCOUNTER — Encounter: Payer: 59 | Admitting: Vascular Surgery

## 2018-06-18 VITALS — BP 162/107 | HR 100 | Temp 97.8°F | Resp 20 | Ht 64.0 in | Wt 125.4 lb

## 2018-06-18 DIAGNOSIS — I739 Peripheral vascular disease, unspecified: Secondary | ICD-10-CM

## 2018-06-18 MED ORDER — TRAMADOL HCL 50 MG PO TABS: 50.0000 mg | ORAL_TABLET | Freq: Four times a day (QID) | ORAL | 0 refills | Status: DC | PRN

## 2018-06-18 NOTE — Progress Notes (Signed)
Patient is a 58 year old female who returns for follow-up today.  She underwent aortobifemoral bypass grafting May 27, 2018.  She is still having considerable pain in her abdominal incision although 3 weeks out from her operation.  She also has had some intermittent issues with constipation.  The constipation is improving.  She states that currently she still needs more pain medicine than extra strength Tylenol.  She states she is not smoking.  She is walking some.  Denies fever or chills.  She has no incisional drainage.  Physical exam:  Vitals:   06/18/18 0851  BP: (!) 162/107  Pulse: 100  Resp: 20  Temp: 97.8 F (36.6 C)  SpO2: 97%  Weight: 125 lb 6.4 oz (56.9 kg)  Height: 5\' 4"  (1.626 m)    Abdomen: Very tender to palpation mainly seems to be peri-incisional and muscle related abdomen is soft incision healing well with no drainage  Extremities: Healing left and right groin incisions plus dorsalis pedis posterior tibial pulse right 2+ dorsalis pedis pulse left  Assessment: Doing well status post aortobifemoral bypass with now palpable pulses in both feet.  She has no claudication symptoms.  Plan: The patient was encouraged to walk at least 30 minutes twice daily to improve her overall muscle soreness as well as improved blood flow to her lower extremities.  She has quit smoking and she was encouraged to stay away from cigarettes.  Since she is still having considerable abdominal pain I did write her a prescription for 28 more tramadol 50 mg tablets today.  I did discuss with her that we would not have any further narcotic pain medication requirements after this.  She is going to continue to try to take extra strength Tylenol intermittently and not use the tramadol daily.  She was told to eat large quantities of fruits vegetables and water to decrease constipation symptoms.  She can also use a stool softener if necessary.  She will follow-up with our APP clinic in 9 months with  bilateral ABIs.  She will follow-up sooner if she has any difficulty.  Ruta Hinds, MD [facility] Office: [phone] Pager: [phone]

## 2018-06-30 ENCOUNTER — Ambulatory Visit (INDEPENDENT_AMBULATORY_CARE_PROVIDER_SITE_OTHER): Payer: 59 | Admitting: Psychiatry

## 2018-06-30 ENCOUNTER — Encounter (HOSPITAL_COMMUNITY): Payer: Self-pay | Admitting: Psychiatry

## 2018-06-30 DIAGNOSIS — F331 Major depressive disorder, recurrent, moderate: Secondary | ICD-10-CM | POA: Diagnosis not present

## 2018-06-30 DIAGNOSIS — F411 Generalized anxiety disorder: Secondary | ICD-10-CM | POA: Diagnosis not present

## 2018-06-30 MED ORDER — CITALOPRAM HYDROBROMIDE 20 MG PO TABS: 30.0000 mg | ORAL_TABLET | Freq: Every day | ORAL | 0 refills | Status: DC

## 2018-06-30 NOTE — Progress Notes (Signed)
Psychiatric Initial Adult Assessment   Patient Identification: Andrea Fleming MRN:  [id number] Date of Evaluation:  06/30/2018 Referral Source: primary care Chief Complaint:   Chief Complaint    Depression     Visit Diagnosis:    ICD-10-CM   1. MDD (major depressive disorder), recurrent episode, moderate (HCC)  F33.1   2. GAD (generalized anxiety disorder)  F41.1    I connected with Andrea Fleming on 06/30/18 at  2:00 PM EDT by a video enabled telemedicine application and verified that I am speaking with the correct person using two identifiers.   I discussed the limitations of evaluation and management by telemedicine and the availability of in person appointments. The patient expressed understanding and agreed to proceed.   History of Present Illness:  Patient is a 58 years old currently single Caucasian female referred by primary care physician for management of depression she was having weakness and pain in her legs initially was diagnosed with arthritis and psoriatic arthritis.  Patient did not get any better she was then referred to vascular surgery after having ultrasounds done diagnosed with aortic insufficiency and had to get the aortic bypass grafting done.  She is currently not working surgery was done in may She has been on pain medication including tramadol,  before the surgery she was having blackening of the skin.  fortunately after surgery she is having adequate blood flow and her pain and discoloration has improved  She has gone depressed since this incident for the last couple of months which is gotten progressively worse her primary care be started back on citalopram 10 mg now she is taking even a dose of 20 mg her last 3 to 4 days still feels down withdrawn decreased energy she worries about the past she worries about her finances she believes her worries are excessive it is affecting her sleep.  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  She has been asked to be off work for now.  She is having crying spells and become dysphoric easily   Duration : recurrent depression. Recent 2-3 months worsening Severity : 5/10. 10 being no depression Modifying factor BF, house  Aggravating factor: aortoiliac occlusive disease , aortic vascular bypass surgery done in may 2020. Deaths in family, finances, pain   Past Psychiatric History: depression  Previous Psychotropic Medications: Yes   Substance Abuse History in the last 12 months:  No.  Consequences of Substance Abuse: NA  Past Medical History:  Past Medical History:  Diagnosis Date  . Allergy   . Condyloma 1993  . Depression   . Hyperlipidemia     Past Surgical History:  Procedure Laterality Date  . AORTA - BILATERAL FEMORAL ARTERY BYPASS GRAFT Bilateral 05/27/2018   Procedure: AORTA BIFEMORAL BYPASS GRAFT;  Surgeon: Sherren KernsFields, Charles E, MD;  [facility];  Service: Vascular;  Laterality: Bilateral;  . Ear drum surg    . PELVIC LAPAROSCOPY  1997   rt ectopic    Family Psychiatric History: denies  Family History:  Family History  Problem Relation Age of Onset  . Stroke Mother   . Hypertension Mother     Social History:   Social History   Socioeconomic History  . Marital status: Single    Spouse name: Not on file  . Number of children: Not on file  . Years of education: Not on file  . Highest education level: Not on  file  Occupational History  . Not on file  Social Needs  . Financial resource strain: Not on file  . Food insecurity    Worry: Not on file    Inability: Not on file  . Transportation needs    Medical: Not on file    Non-medical: Not on file  Tobacco Use  . Smoking status: Current Some Day Smoker    Packs/day: 0.25    Types: Cigarettes  . Smokeless tobacco: Never Used  Substance and Sexual Activity  . Alcohol use: Yes    Comment: occ. wine  . Drug use: No  . Sexual activity: Yes    Birth  control/protection: Pill  Lifestyle  . Physical activity    Days per week: Not on file    Minutes per session: Not on file  . Stress: Not on file  Relationships  . Social Musicianconnections    Talks on phone: Not on file    Gets together: Not on file    Attends religious service: Not on file    Active member of club or organization: Not on file    Attends meetings of clubs or organizations: Not on file    Relationship status: Not on file  Other Topics Concern  . Not on file  Social History Narrative  . Not on file    Additional Social History: Grew up with mom and parents were divorced, states that mom didn't tell the Dad I grew up was not my dad . That has upsetted her later and feels down about her childhood. 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 Mom has lived most of her life with her  Allergies:   Allergies  Allergen Reactions  . Sulfa Antibiotics Nausea And Vomiting    GI upset   . Sulfamethoxazole Nausea And Vomiting    Metabolic Disorder Labs: No results found for: HGBA1C, MPG No results found for: PROLACTIN No results found for: CHOL, TRIG, HDL, CHOLHDL, VLDL, LDLCALC No results found for: TSH  Therapeutic Level Labs: No results found for: LITHIUM No results found for: CBMZ No results found for: VALPROATE  Current Medications: Current Outpatient Medications  Medication Sig Dispense Refill  . ALPRAZolam (XANAX) 0.5 MG tablet Take 0.5 mg by mouth at bedtime as needed for anxiety.     Marland Kitchen. aspirin EC 325 MG tablet Take 1 tablet (325 mg total) by mouth daily. 30 tablet 0  . bisacodyl (BISACODYL) 5 MG EC tablet Take 1 tablet (5 mg total) by mouth daily as needed for moderate constipation. (Patient not taking: Reported on 06/18/2018) 30 tablet 0  . cetirizine (ZYRTEC) 10 MG tablet Take 10 mg by mouth daily.      . citalopram (CELEXA) 20 MG tablet Take 1.5 tablets (30 mg total) by mouth daily. 45 tablet 0  . glycerin adult 2 g suppository Place 1  suppository rectally as needed for constipation. 12 suppository 0  . lidocaine (LMX) 4 % cream Apply 1 application topically 2 (two) times daily as needed (foot pain).    . povidone-iodine (BETADINE) 10 % ointment Apply 1 application topically as needed for wound care.    . simvastatin (ZOCOR) 10 MG tablet Take 1 tablet (10 mg total) by mouth at bedtime. 30 tablet 12  . traMADol (ULTRAM) 50 MG tablet Take 1 tablet (50 mg total) by mouth every 6 (six) hours as needed for severe pain. 28 tablet 0   No current facility-administered medications for this visit.  Psychiatric Specialty Exam: Review of Systems  Cardiovascular: Negative for chest pain.  Psychiatric/Behavioral: Positive for depression. The patient is nervous/anxious.     Last menstrual period 08/17/2011.There is no height or weight on file to calculate BMI.  General Appearance: Casual  Eye Contact:  Fair  Speech:  Slow  Volume:  Normal  Mood:  Dysphoric  Affect:  Congruent  Thought Process:  Goal Directed  Orientation:  Full (Time, Place, and Person)  Thought Content:  Rumination  Suicidal Thoughts:  No  Homicidal Thoughts:  No  Memory:  Immediate;   Fair Recent;   Fair  Judgement:  Fair  Insight:  Shallow  Psychomotor Activity:  Decreased  Concentration:  Concentration: Fair and Attention Span: Fair  Recall:  [employer] of Knowledge:Fair  Language: Fair  Akathisia:  No  Handed:  Right  AIMS (if indicated):  not done  Assets:  Desire for Improvement Housing  ADL's:  Intact  Cognition: WNL  Sleep:  variable   Screenings:   Assessment and Plan: as follows MDD recurrent moderate to severe: relavant to recent surgeries, finances and pain. Increase celexa to 20mg  and then 30mg  in one week if tolerates it Denies suicidal toughts. Reviewed meds and options to increase for now and change if needed. She understands the plan and agrees.  GAD: increase celexa as above  Recommend therapy but she is reluctant to  start as of now Assign ME time and cut down worry time, increase activities during the day and work on sleep hygiene and distraction from negative toughts  I discussed the assessment and treatment plan with the patient. The patient was provided an opportunity to ask questions and all were answered. The patient agreed with the plan and demonstrated an understanding of the instructions.   The patient was advised to call back or seek an in-person evaluation if the symptoms worsen or if the condition fails to improve as anticipated.  I provided 50  minutes of non-face-to-face time during this encounter. Fu 2-3 w or earlier if needed   Merian Capron, MD 6/23/20202:39 PM

## 2018-07-02 ENCOUNTER — Other Ambulatory Visit: Payer: Self-pay

## 2018-07-02 ENCOUNTER — Encounter: Payer: Self-pay | Admitting: Vascular Surgery

## 2018-07-02 ENCOUNTER — Ambulatory Visit (INDEPENDENT_AMBULATORY_CARE_PROVIDER_SITE_OTHER): Payer: Self-pay | Admitting: Vascular Surgery

## 2018-07-02 VITALS — BP 177/95 | HR 100 | Temp 97.5°F | Resp 20 | Ht 64.0 in | Wt 124.8 lb

## 2018-07-02 DIAGNOSIS — I739 Peripheral vascular disease, unspecified: Secondary | ICD-10-CM

## 2018-07-02 DIAGNOSIS — R1030 Lower abdominal pain, unspecified: Secondary | ICD-10-CM

## 2018-07-02 NOTE — Progress Notes (Signed)
Patient is a 58 year old female who returns for follow-up today.  She underwent aortobifemoral bypass grafting approximately 6 weeks ago 05/27/18.  She still has abdominal pain and groin incision pain.  She states she takes Tylenol 4-5 times per day.  She also takes occasional tramadol.  She is still struggling also with constipation symptoms.  Her last bowel movement was yesterday.  She denies any claudication symptoms.  She has not had any nausea or vomiting.  She states she does have abdominal pain after eating.  She did stop her statin because she thought this was causing her GI upset.  Physical exam:  Vitals:   07/02/18 1320  BP: (!) 177/95  Pulse: 100  Resp: 20  Temp: (!) 97.5 F (36.4 C)  SpO2: 96%  Weight: 124 lb 12.8 oz (56.6 kg)  Height: 5\' 4"  (1.626 m)    Abdomen: Healing midline laparotomy incision no drainage no erythema exquisitely tender to touch even the skin  Extremities: Healing groin incisions no drainage no erythema exquisitely tender to touch even the skin  Vascular: 2+ dorsalis pedis pulses bilaterally  Assessment: Postoperative pain out of proportion to this far out from her original operation.  Constipation.  Plan: In light of the fact the patient still has significant abdominal and incisional pain we will obtain a CT scan to rule out obstruction or any evidence of hernia.  She will have a CT angiogram today.  We will follow-up with her later in the day after her CT scan.  Meanwhile she will continue to eat large amounts of fruits and vegetables drink lots of water and take her Colace to reduce constipation symptoms.  She will continue her current pain medicine regimen.  Ruta Hinds, MD [facility] Office: [phone] Pager: [phone]

## 2018-07-03 ENCOUNTER — Telehealth: Payer: Self-pay | Admitting: Vascular Surgery

## 2018-07-03 ENCOUNTER — Ambulatory Visit
Admission: RE | Admit: 2018-07-03 | Discharge: 2018-07-03 | Disposition: A | Discharge status: Home / Self Care | Payer: Self-pay | Source: Ambulatory Visit | Attending: Vascular Surgery | Admitting: Vascular Surgery

## 2018-07-03 DIAGNOSIS — R1030 Lower abdominal pain, unspecified: Secondary | ICD-10-CM

## 2018-07-03 MED ORDER — IOPAMIDOL (ISOVUE-300) INJECTION 61%
80.0000 mL | Freq: Once | INTRAVENOUS | Status: AC | PRN
  Administered 2018-07-03: 80 mL via INTRAVENOUS

## 2018-07-03 NOTE — Telephone Encounter (Signed)
Called pt.  Her CT did not show anything worrisome as a cause for her abdominal pain.  She was reassured.  She will follow up with me in 1 month  Ruta Hinds

## 2018-07-03 NOTE — Telephone Encounter (Signed)
-----   Message from Elam Dutch, MD sent at 07/03/2018  4:05 PM EDT ----- Called pt regarding her CT.  She needs follow up appt in 1 month with me.  She does not need further narcotic prescriptions  Ruta Hinds

## 2018-07-16 ENCOUNTER — Ambulatory Visit: Payer: 59 | Admitting: Vascular Surgery

## 2018-07-22 ENCOUNTER — Ambulatory Visit (HOSPITAL_COMMUNITY): Payer: 59 | Admitting: Psychiatry

## 2018-08-06 ENCOUNTER — Encounter: Payer: Self-pay | Admitting: Vascular Surgery

## 2018-08-06 ENCOUNTER — Other Ambulatory Visit: Payer: Self-pay

## 2018-08-06 ENCOUNTER — Ambulatory Visit (INDEPENDENT_AMBULATORY_CARE_PROVIDER_SITE_OTHER): Payer: Self-pay | Admitting: Vascular Surgery

## 2018-08-06 VITALS — BP 125/82 | HR 92 | Temp 97.4°F | Resp 20 | Ht 64.0 in | Wt 125.8 lb

## 2018-08-06 DIAGNOSIS — I739 Peripheral vascular disease, unspecified: Secondary | ICD-10-CM

## 2018-08-06 NOTE — Progress Notes (Signed)
Patient is a 58 year old female who returns for follow-up today.  She underwent aortobifemoral bypass grafting May 27, 2018.  She has significantly recovered from a pain aspect since seeing her 1 month ago.  She has minimal incisional pain at this point.  She still has some sensitivity around the incision.  She has no claudication symptoms.  She is not smoking.  She is currently taking 325 mg of aspirin once a day.  I told her she could cut this back to 81 mg once a day.    Physical exam:  Vitals:   08/06/18 1238  BP: 125/82  Pulse: 92  Resp: 20  Temp: (!) 97.4 F (36.3 C)  SpO2: 97%  Weight: 125 lb 12.8 oz (57.1 kg)  Height: 5\' 4"  (1.626 m)    2+ dorsalis pedis pulses bilaterally 2+ femoral pulses well-healed groin and abdominal incision no evidence of hernia  Dry gangrene tip right fifth toe  Extremities: Diffuse ecchymosis hands and forearms  Assessment: Doing well status post aortobifemoral bypass grafting  Plan: Reduce aspirin dose to 81 mg once a day  Informed patient most likely the tip of her right fifth toe with slough.  I told her if she develops severe pain in this toe or drainage or evidence of infection to come back sooner we would consider amputation of the fifth toe.  At this point I believe she can return to work with no heavy lifting for at least 1 more month.  She can then be completely full duty.  She is able to work full-time without the lifting component as of now.  She will follow-up in 1 year with bilateral ABIs.  She will see 1 of our APP's at that office visit.  Ruta Hinds, MD [facility] Office: [phone] Pager: [phone]

## 2019-01-07 ENCOUNTER — Other Ambulatory Visit: Payer: Self-pay

## 2019-01-07 ENCOUNTER — Ambulatory Visit: Payer: 59 | Attending: Internal Medicine

## 2019-01-07 DIAGNOSIS — Z20822 Contact with and (suspected) exposure to covid-19: Secondary | ICD-10-CM

## 2019-01-09 LAB — NOVEL CORONAVIRUS, NAA: SARS-CoV-2, NAA: NOT DETECTED

## 2019-07-30 ENCOUNTER — Other Ambulatory Visit: Payer: Self-pay

## 2019-07-30 DIAGNOSIS — I739 Peripheral vascular disease, unspecified: Secondary | ICD-10-CM

## 2019-08-13 ENCOUNTER — Encounter (HOSPITAL_COMMUNITY): Payer: 59

## 2019-08-13 ENCOUNTER — Ambulatory Visit: Payer: 59

## 2019-09-02 ENCOUNTER — Ambulatory Visit (INDEPENDENT_AMBULATORY_CARE_PROVIDER_SITE_OTHER): Payer: 59 | Admitting: Physician Assistant

## 2019-09-02 ENCOUNTER — Other Ambulatory Visit: Payer: Self-pay

## 2019-09-02 ENCOUNTER — Ambulatory Visit (HOSPITAL_COMMUNITY)
Admission: RE | Admit: 2019-09-02 | Discharge: 2019-09-02 | Disposition: A | Discharge status: Home / Self Care | Payer: 59 | Source: Ambulatory Visit | Attending: Vascular Surgery | Admitting: Vascular Surgery

## 2019-09-02 VITALS — BP 118/79 | HR 71 | Temp 98.3°F | Resp 20 | Ht 64.0 in | Wt 137.0 lb

## 2019-09-02 DIAGNOSIS — I739 Peripheral vascular disease, unspecified: Secondary | ICD-10-CM

## 2019-09-02 DIAGNOSIS — Z95828 Presence of other vascular implants and grafts: Secondary | ICD-10-CM

## 2019-09-02 NOTE — Progress Notes (Signed)
Office Note     CC:  follow up Requesting Provider:  Elfredia Nevins, MD  HPI: Andrea Fleming is a 59 y.o. (1960/09/29) female who presents for surveillance follow up of aortobifemoral bypass. She had her aortobifemoral bypass on May 20th, 2020 by Dr. Darrick Penna. She was last seen in July of 2020 at which time she was doing well. She did have some incisional discomfort and also dry gangrene of her right 5th toe which was anticipated to slough off.  She presents today for her 1 year follow up. She has been doing great. Her right 5th toe completely healed. She is ambulating without any pain. She denies any claudication symptoms, rest pain or any non healing wounds. She was previously having a lot of incisional soreness and this has all resolved. She is not having any back or abdominal pain. She continues to no longer smoke which I congratulated her on  The pt not on a statin for cholesterol management.  The pt is on a daily aspirin.   Other AC: none The pt is on ACE for hypertension.   The pt is not diabetic. Tobacco hx:  Former, quit May 2020  Past Medical History:  Diagnosis Date  . Allergy   . Condyloma 1993  . Depression   . Hyperlipidemia     Past Surgical History:  Procedure Laterality Date  . AORTA - BILATERAL FEMORAL ARTERY BYPASS GRAFT Bilateral 05/27/2018   Procedure: AORTA BIFEMORAL BYPASS GRAFT;  Surgeon: Sherren Kerns, MD;  Location: [facility] OR;  Service: Vascular;  Laterality: Bilateral;  . Ear drum surg    . PELVIC LAPAROSCOPY  1997   rt ectopic    Social History   Socioeconomic History  . Marital status: Single    Spouse name: Not on file  . Number of children: Not on file  . Years of education: Not on file  . Highest education level: Not on file  Occupational History  . Not on file  Tobacco Use  . Smoking status: Former Smoker    Packs/day: 0.25    Types: Cigarettes    Quit date: 05/27/2018    Years since quitting: 1.2  . Smokeless tobacco: Never Used   Vaping Use  . Vaping Use: Never used  Substance and Sexual Activity  . Alcohol use: Yes    Comment: occ. wine  . Drug use: No  . Sexual activity: Yes    Birth control/protection: Pill  Other Topics Concern  . Not on file  Social History Narrative  . Not on file   Social Determinants of Health   Financial Resource Strain:   . Difficulty of Paying Living Expenses: Not on file  Food Insecurity:   . Worried About Programme researcher, broadcasting/film/video in the Last Year: Not on file  . Ran Out of Food in the Last Year: Not on file  Transportation Needs:   . Lack of Transportation (Medical): Not on file  . Lack of Transportation (Non-Medical): Not on file  Physical Activity:   . Days of Exercise per Week: Not on file  . Minutes of Exercise per Session: Not on file  Stress:   . Feeling of Stress : Not on file  Social Connections:   . Frequency of Communication with Friends and Family: Not on file  . Frequency of Social Gatherings with Friends and Family: Not on file  . Attends Religious Services: Not on file  . Active Member of Clubs or Organizations: Not on file  . Attends  Club or Organization Meetings: Not on file  . Marital Status: Not on file  Intimate Partner Violence:   . Fear of Current or Ex-Partner: Not on file  . Emotionally Abused: Not on file  . Physically Abused: Not on file  . Sexually Abused: Not on file    Family History  Problem Relation Age of Onset  . Stroke Mother   . Hypertension Mother     Current Outpatient Medications  Medication Sig Dispense Refill  . ALPRAZolam (XANAX) 0.5 MG tablet Take 0.5 mg by mouth at bedtime as needed for anxiety.     Marland Kitchen aspirin EC 325 MG tablet Take 1 tablet (325 mg total) by mouth daily. 30 tablet 0  . bisacodyl (BISACODYL) 5 MG EC tablet Take 1 tablet (5 mg total) by mouth daily as needed for moderate constipation. 30 tablet 0  . cetirizine (ZYRTEC) 10 MG tablet Take 10 mg by mouth daily.      . citalopram (CELEXA) 20 MG tablet Take 1.5  tablets (30 mg total) by mouth daily. 45 tablet 0  . lisinopril (ZESTRIL) 5 MG tablet TK 1 T PO ONCE D     No current facility-administered medications for this visit.    Allergies  Allergen Reactions  . Sulfa Antibiotics Nausea And Vomiting    GI upset   . Sulfamethoxazole Nausea And Vomiting     REVIEW OF SYSTEMS:  [X]  denotes positive finding, [ ]  denotes negative finding Cardiac  Comments:  Chest pain or chest pressure:    Shortness of breath upon exertion:    Short of breath when lying flat:    Irregular heart rhythm:        Vascular    Pain in calf, thigh, or hip brought on by ambulation:    Pain in feet at night that wakes you up from your sleep:     Blood clot in your veins:    Leg swelling:         Pulmonary    Oxygen at home:    Productive cough:     Wheezing:         Neurologic    Sudden weakness in arms or legs:     Sudden numbness in arms or legs:     Sudden onset of difficulty speaking or slurred speech:    Temporary loss of vision in one eye:     Problems with dizziness:         Gastrointestinal    Blood in stool:     Vomited blood:         Genitourinary    Burning when urinating:     Blood in urine:        Psychiatric    Major depression:         Hematologic    Bleeding problems:    Problems with blood clotting too easily:        Skin    Rashes or ulcers:        Constitutional    Fever or chills:      PHYSICAL EXAMINATION:  Vitals:   09/02/19 0923  BP: 118/79  Pulse: 71  Resp: 20  Temp: 98.3 F (36.8 C)  TempSrc: Temporal  SpO2: 97%  Weight: 137 lb (62.1 kg)  Height: 5\' 4"  (1.626 m)    General:  WDWN in NAD; vital signs documented above Gait: Normal HENT: WNL, normocephalic Pulmonary: normal non-labored breathing , without wheezing Cardiac: regular HR, without  Murmurs without carotid  bruit Abdomen: soft, NT, no masses Vascular Exam/Pulses:  Right Left  Radial 2+ (normal) 2+ (normal)  Ulnar 2+ (normal) 2+ (normal)   Femoral 2+ (normal) 2+ (normal)  Popliteal 2+ (normal) 2+ (normal)  DP 2+ (normal) 2+ (normal)  PT 2+ (normal) 2+ (normal)   Extremities: without ischemic changes, without Gangrene , without cellulitis; without open wounds;  Musculoskeletal: no muscle wasting or atrophy  Neurologic: A&O X 3;  No focal weakness or paresthesias are detected Psychiatric:  The pt has Normal affect.   Non-Invasive Vascular Imaging:   +-------+-----------+-----------+------------+------------+  ABI/TBIToday's ABIToday's TBIPrevious ABIPrevious TBI  +-------+-----------+-----------+------------+------------+  Right 1.07    0.31         0.36     not detected    +-------+-----------+-----------+------------+------------+  Left  1.10    0.61        0.43      not detected    +-------+-----------+-----------+------------+------------+    ASSESSMENT/PLAN:: 59 y.o. female here for follow up for her aortobifemoral bypass on May 20th, 2020 by Dr. Darrick Penna. She has done well post operatively. Her right 5th toe is completely healed. She is not having any claudication, rest pain or non healing wounds. She continues to not smoke. She is currently taking Aspirin 81 mg daily. Her ABIs today are much improved from her pre procedure ABIs  She will follow up in 1 year with ABIs   Graceann Congress, PA-C [facility] [phone]  Clinic MD:  Dr. Darrick Penna

## 2021-01-04 ENCOUNTER — Other Ambulatory Visit (HOSPITAL_COMMUNITY): Payer: Self-pay | Admitting: Physician Assistant

## 2021-01-04 ENCOUNTER — Other Ambulatory Visit (HOSPITAL_COMMUNITY): Payer: Self-pay | Admitting: Internal Medicine

## 2021-01-04 DIAGNOSIS — Z1231 Encounter for screening mammogram for malignant neoplasm of breast: Secondary | ICD-10-CM

## 2021-01-18 ENCOUNTER — Other Ambulatory Visit: Payer: Self-pay

## 2021-01-18 ENCOUNTER — Ambulatory Visit (HOSPITAL_COMMUNITY)
Admission: RE | Admit: 2021-01-18 | Discharge: 2021-01-18 | Disposition: A | Discharge status: Home / Self Care | Payer: 59 | Source: Ambulatory Visit | Attending: Physician Assistant | Admitting: Physician Assistant

## 2021-01-18 DIAGNOSIS — Z1231 Encounter for screening mammogram for malignant neoplasm of breast: Secondary | ICD-10-CM | POA: Insufficient documentation

## 2021-01-22 ENCOUNTER — Other Ambulatory Visit (HOSPITAL_COMMUNITY): Payer: Self-pay | Admitting: Physician Assistant

## 2021-01-22 DIAGNOSIS — R928 Other abnormal and inconclusive findings on diagnostic imaging of breast: Secondary | ICD-10-CM

## 2021-01-25 ENCOUNTER — Encounter (HOSPITAL_COMMUNITY): Payer: Self-pay

## 2021-01-25 ENCOUNTER — Other Ambulatory Visit: Payer: Self-pay

## 2021-01-25 ENCOUNTER — Ambulatory Visit (HOSPITAL_COMMUNITY)
Admission: RE | Admit: 2021-01-25 | Discharge: 2021-01-25 | Disposition: A | Discharge status: Home / Self Care | Payer: 59 | Source: Ambulatory Visit | Attending: Physician Assistant | Admitting: Physician Assistant

## 2021-01-25 DIAGNOSIS — R928 Other abnormal and inconclusive findings on diagnostic imaging of breast: Secondary | ICD-10-CM | POA: Diagnosis not present

## 2021-04-09 IMAGING — CT CT ANGIOGRAPHY AOBIFEM WITHOUT AND WITH CONTRAST
1 of 2 series · 11 of 32 positions shown, 13 images · IV contrast (APPLIED)
Comparison: None.

CLINICAL DATA: Bilateral hip and leg pain for the past 6 months.
Evaluate for peripheral arterial disease.

EXAM:
CT ANGIOGRAPHY OF ABDOMINAL AORTA WITH ILIOFEMORAL RUNOFF
TECHNIQUE: Multidetector CT imaging of the abdomen, pelvis and lower
extremities was performed using the standard protocol during bolus
administration of intravenous contrast. Multiplanar CT image
reconstructions and MIPs were obtained to evaluate the vascular
anatomy.
CONTRAST:  100mL 4AI50A-ZIM IOPAMIDOL (4AI50A-ZIM) INJECTION 76%

[Series 14: angioao-bifem 3mm · axial · 0.75mm/px · z∈[-1196,-51]mm · 11 of 259 slices shown, 13 images]
[im 20/259  soft-tissue]
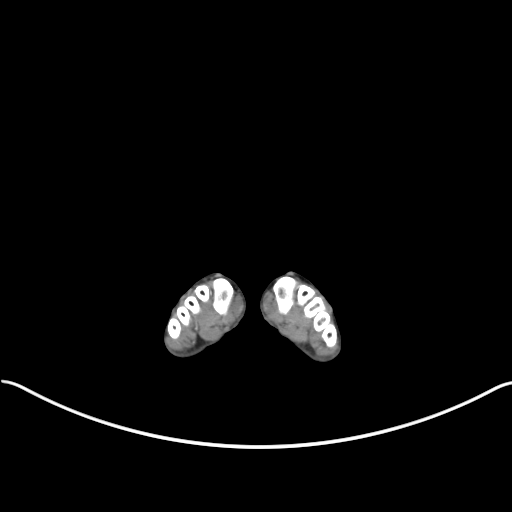
[im 20/259  bone]
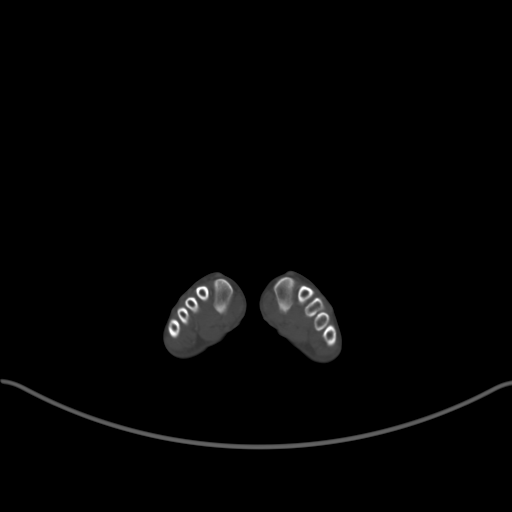
[im 48/259  soft-tissue]
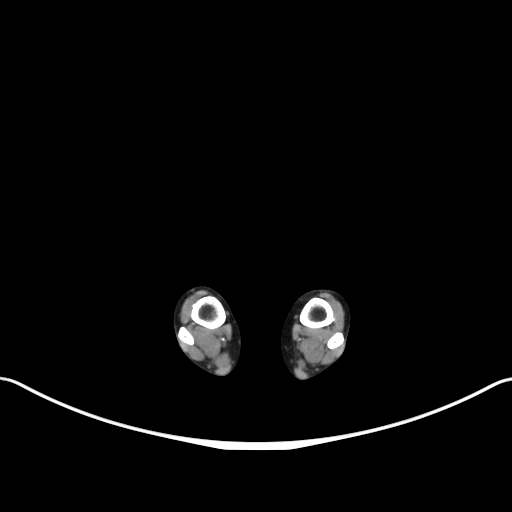
[im 87/259  soft-tissue]
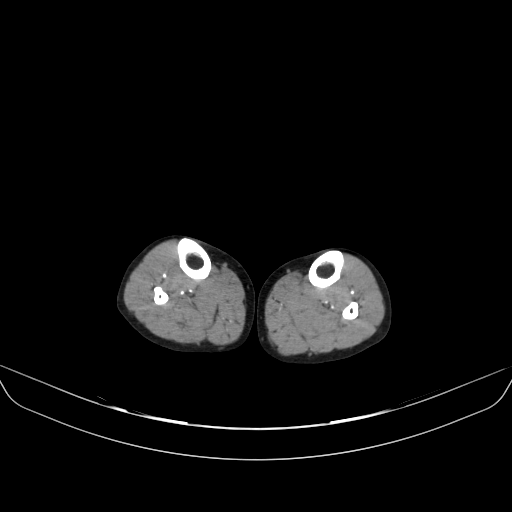
[im 115/259  soft-tissue]
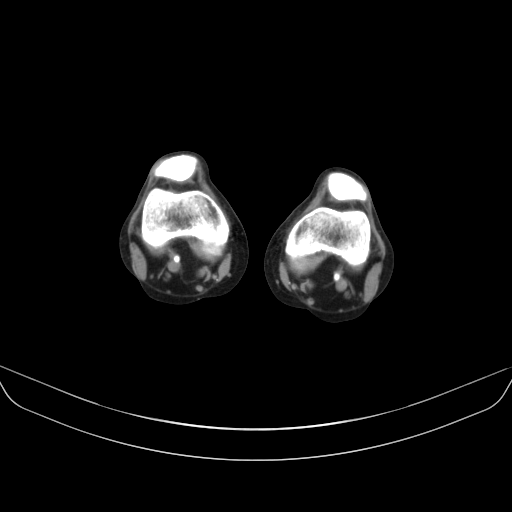
[im 144/259  soft-tissue]
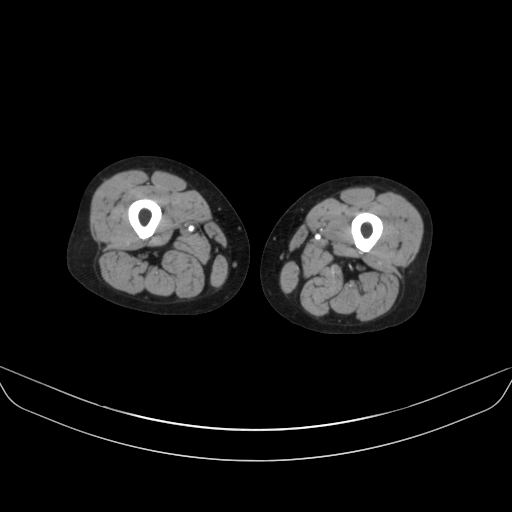
[im 173/259  soft-tissue]
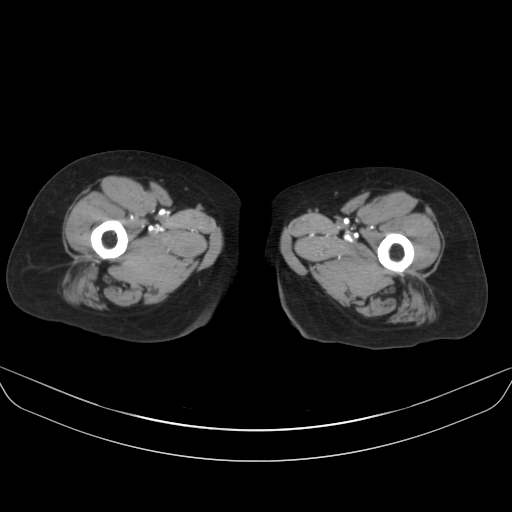
[im 211/259  soft-tissue]
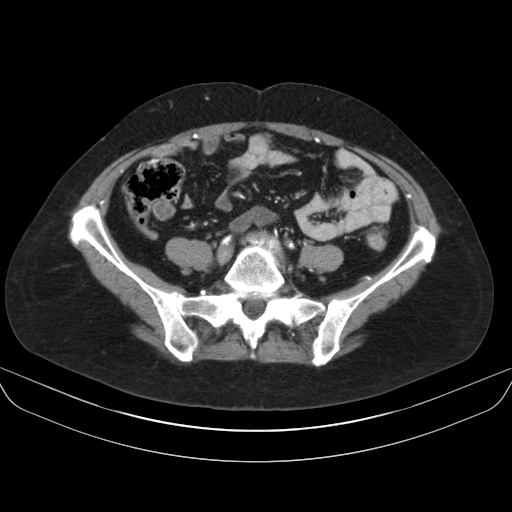
[im 220/259  lung]
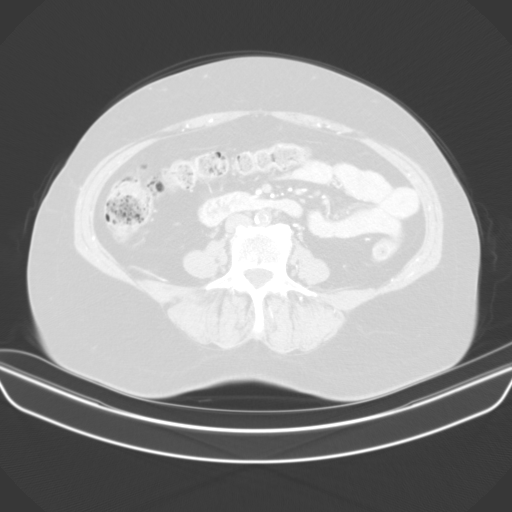
[im 230/259  lung]
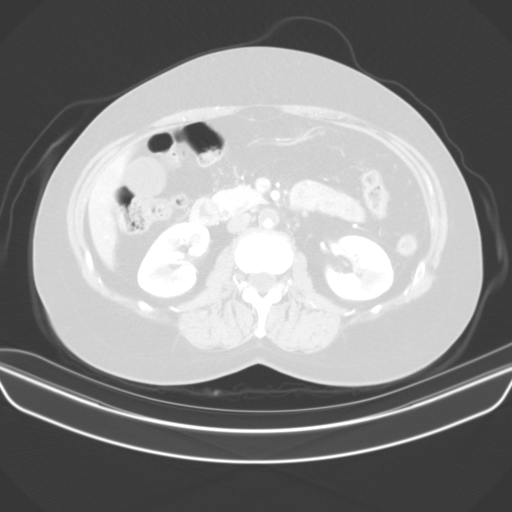
[im 239/259  soft-tissue]
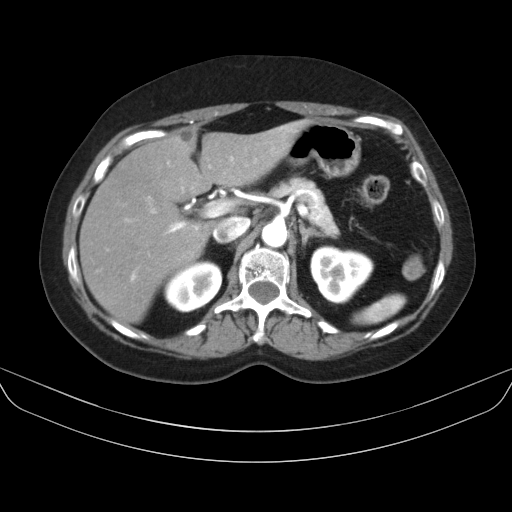
[im 239/259  lung]
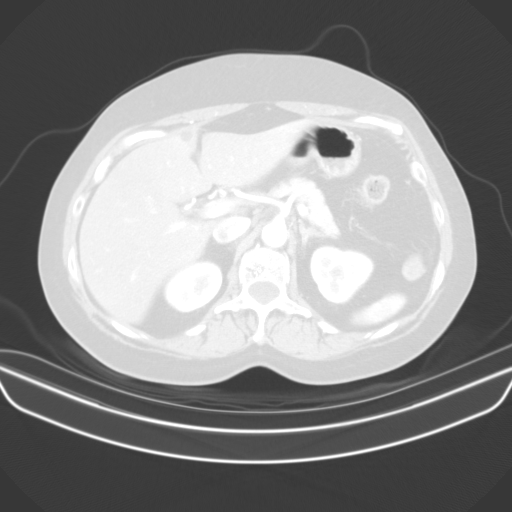
[im 249/259  lung]
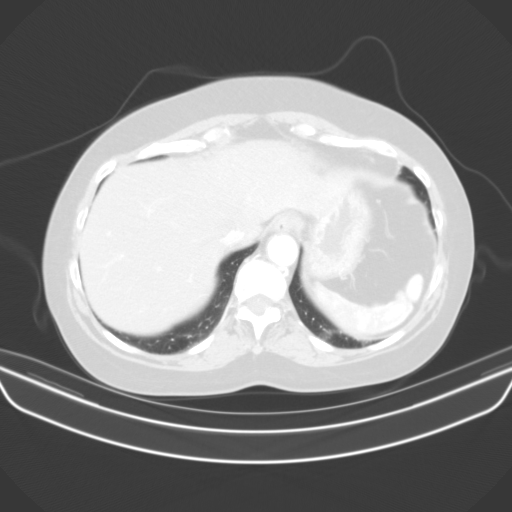

[11 of 32 positions shown; findings below may reference images not displayed]

FINDINGS: VASCULAR

Aorta: There is a large amount of irregular predominantly
noncalcified atherosclerotic plaque throughout the normal caliber
abdominal aorta. The abdominal aorta occludes at the inferior
endplate of L3 just superior to the takeoff of the IMA which is also
occluded at its origin. No abdominal aortic dissection or periaortic
stranding.

Celiac: Widely patent without a hemodynamically significant
stenosis. Conventional branching pattern.

SMA: Widely patent without hemodynamically significant stenosis.
Conventional branching pattern. The SMA provides collateral supply
early reconstitution to the IMA.

Renals: Solitary bilaterally the bilateral renal arteries are widely
patent without hemodynamically significant narrowing. No discrete
areas of vessel irregularity to suggest FMD.

IMA: Occluded at its origin with early reconstitution via collateral
supply from the SMA.

_________________________________________________________

RIGHT Lower Extremity

Inflow: The right common iliac artery is occluded throughout its
imaged course.

There is atretic reconstitution of both the right external and
internal iliac arteries via retrograde supply from the ipsilateral
right inferior epigastric and deep iliac circumflex arteries.

Outflow: Minimal amount of mixed calcified and noncalcified
atherosclerotic plaque within the right common femoral artery, not
resulting in hemodynamically significant stenosis.

The right deep femoral artery remains widely patent.

The right superficial femoral artery is widely patent.

The right above and below-knee popliteal arteries are of normal
caliber and widely patent without hemodynamically significant
stenosis.

Runoff: 3 vessel runoff to the right lower leg and foot. The
right-sided dorsalis pedis artery is patent to the level of the
forefoot. No discrete internal filling defect suggest distal
embolism.

_________________________________________________________

LEFT Lower Extremity

Inflow: The left common iliac artery is occluded throughout its
course.

There is atretic reconstitution of the left external and internal
iliac arteries via hypertrophied collateral supply from the
ipsilateral left inferior epigastric and deep iliac circumflex
arteries.

Outflow: The left common femoral artery is widely patent without
hemodynamically significant stenosis.

The left deep and superficial femoral arteries are widely patent
without hemodynamically significant stenosis.

The left above and below-knee popliteal arteries are of normal
caliber and widely patent without hemodynamically significant
stenosis.

Runoff: Three-vessel runoff to the left lower leg and foot, though
the peroneal artery is noted to be mildly diminutive. The left-sided
dorsalis pedis artery is patent though there is short-segment
occlusion and early reconstitution at the level of the hindfoot
(image 229, series 14).

Veins: Pelvic venous system and IVC appear patent on this arterial
phase examination.

Review of the MIP images confirms the above findings.

_________________________________________________________

_________________________________________________________

NON-VASCULAR

Evaluation the abdominal organs is limited to the arterial phase of
enhancement.

Lower chest: Limited visualization of lower thorax is negative for
focal airspace opacity or pleural effusion.

Normal heart size.  No pericardial effusion.

Hepatobiliary: Normal hepatic contour. Note is made of an
approximately 0.9 cm hypoattenuating lesion adjacent to the fissure
for ligamentum teres which is too small to adequately characterize
of favored to represent a hepatic cyst. Additional smaller
hypoattenuating lesions are seen within the caudal subcapsular
aspect the right lobe of the liver which are also too small to
adequately characterize though also favored to represent hepatic
cysts (image 29, series 14). Normal appearance of the gallbladder
given degree distention. No radiopaque gallstones. No intra or
extrahepatic biliary ductal dilatation. No ascites.

Pancreas: Normal appearance of the pancreas.

Spleen: Normal early arterial phase enhancement of the spleen.

Adrenals/Urinary Tract: Subcentimeter hypoattenuating left-sided
renal lesion is too small to adequately characterize of favored to
represent a renal cyst. No discrete right-sided renal lesions. No
definite renal stones this postcontrast examination. No urine
obstruction or perinephric stranding

Normal appearance of the bilateral adrenal glands.

Normal appearance of the urinary bladder given degree distention.

Stomach/Bowel: No discrete areas of bowel wall thickening or
abnormal enhancement. No evidence of enteric obstruction. Partially
radiopaque presumed pill fragment within nondilated loop of distal
small bowel. Normal appearance of the terminal ileum and the
retrocecal appendix. No pneumoperitoneum, pneumatosis or portal
venous gas.

Lymphatic: No bulky retroperitoneal, mesenteric, pelvic or inguinal
lymphadenopathy.

Reproductive: Normal appearance of the pelvic organs. No free fluid
the pelvic cul-de-sac.

Other: Regional soft tissues appear normal.

Musculoskeletal: No acute or aggressive osseous abnormalities.
IMPRESSION: Abdominal and pelvic vasculature Impression:

1. Large amount of predominantly noncalcified atherosclerotic plaque
results in occlusion of the infrarenal abdominal aorta extending to
involve both common iliac arteries.
2. Occlusion of the origin of the IMA with early reconstitution via
collateral supply from the SMA.
3.  Aortic Atherosclerosis ([id number]-935.5).

Right lower extremity vasculature Impression:

1. Atretic reconstitution of the right external and internal iliac
arteries via collateral supply from the ipsilateral right inferior
epigastric and deep iliac circumflex arteries.
2. The outflow vasculature of the right lower extremity is widely
patent.
3. Three-vessel runoff to the right lower leg and foot. The
right-sided dorsalis pedis artery appears patent to the level of the
forefoot. No evidence of distal embolism.

Left lower extremity vasculature Impression:

1. Atretic reconstitution of the left external and internal iliac
arteries via collateral supply from the ipsilateral left inferior
epigastric and deep iliac circumflex arteries.
2. The outflow vasculature of the left lower extremity is widely
patent.
3. While there is a three-vessel runoff to the left lower leg and
foot, there is short-segment occlusion of the dorsalis pedis artery
at the level of the hindfoot as could be seen in the setting distal
embolism.

Nonvascular Impression:

1. No acute findings within the abdomen or pelvis. Specifically, no
evidence of end organ ischemia.

## 2022-02-15 ENCOUNTER — Other Ambulatory Visit (HOSPITAL_COMMUNITY): Payer: Self-pay | Admitting: Family Medicine

## 2022-02-15 DIAGNOSIS — R928 Other abnormal and inconclusive findings on diagnostic imaging of breast: Secondary | ICD-10-CM

## 2022-02-15 DIAGNOSIS — R921 Mammographic calcification found on diagnostic imaging of breast: Secondary | ICD-10-CM

## 2022-03-07 ENCOUNTER — Ambulatory Visit (HOSPITAL_COMMUNITY)
Admission: RE | Admit: 2022-03-07 | Discharge: 2022-03-07 | Disposition: A | Discharge status: Home / Self Care | Payer: 59 | Source: Ambulatory Visit | Attending: Family Medicine | Admitting: Family Medicine

## 2022-03-07 ENCOUNTER — Encounter (HOSPITAL_COMMUNITY): Payer: Self-pay

## 2022-03-07 DIAGNOSIS — R928 Other abnormal and inconclusive findings on diagnostic imaging of breast: Secondary | ICD-10-CM | POA: Diagnosis present

## 2022-03-07 DIAGNOSIS — R921 Mammographic calcification found on diagnostic imaging of breast: Secondary | ICD-10-CM

## 2022-03-21 ENCOUNTER — Other Ambulatory Visit (HOSPITAL_COMMUNITY): Payer: Self-pay | Admitting: Family Medicine

## 2022-03-21 DIAGNOSIS — I739 Peripheral vascular disease, unspecified: Secondary | ICD-10-CM

## 2022-03-28 ENCOUNTER — Ambulatory Visit (HOSPITAL_COMMUNITY)
Admission: RE | Admit: 2022-03-28 | Discharge: 2022-03-28 | Disposition: A | Discharge status: Home / Self Care | Payer: 59 | Source: Ambulatory Visit | Attending: Family Medicine | Admitting: Family Medicine

## 2022-03-28 DIAGNOSIS — I739 Peripheral vascular disease, unspecified: Secondary | ICD-10-CM | POA: Diagnosis present

## 2022-04-17 ENCOUNTER — Ambulatory Visit: Payer: 59 | Admitting: Vascular Surgery

## 2022-04-17 ENCOUNTER — Encounter: Payer: Self-pay | Admitting: Vascular Surgery

## 2022-04-17 VITALS — BP 120/83 | HR 90 | Temp 97.5°F | Ht 64.0 in | Wt 135.8 lb

## 2022-04-17 DIAGNOSIS — I70219 Atherosclerosis of native arteries of extremities with intermittent claudication, unspecified extremity: Secondary | ICD-10-CM | POA: Diagnosis not present

## 2022-04-17 MED ORDER — TRAMADOL HCL 50 MG PO TABS: 50.0000 mg | ORAL_TABLET | Freq: Four times a day (QID) | ORAL | 0 refills | Status: DC | PRN

## 2022-04-17 MED ORDER — GABAPENTIN 300 MG PO CAPS: 300.0000 mg | ORAL_CAPSULE | Freq: Every day | ORAL | 0 refills | Status: DC

## 2022-04-17 NOTE — Progress Notes (Signed)
[facility] [place]  Patient name: Andrea Fleming MRN: [id number] DOB: 1960-08-05 Sex: female  REASON FOR VISIT: Evaluation remittent claudication right leg  HPI: Andrea Fleming is a 62 y.o. female here today for evaluation.  She has a past medical history of gangrenous changes in the toes of her right foot.  She underwent urgent aortobifemoral bypass with Dr. Darrick Penna in May 2020.  She had subsequent healing of her toe.  She had no further gangrenous changes.  Fortunately she stopped smoking at the time of her surgery.  Her ankle arm indices were normal 1.0 bilaterally in August 2021.  She reports that over the past month she has noticed recurrent claudication in her right hip and right thigh and calf.  She has no rest pain.  She has no tissue loss.  He is seen today for further evaluation.  He does have a known history of left subclavian occlusive disease but is asymptomatic associated with this  Past Medical History:  Diagnosis Date   Allergy    Condyloma 1993   Depression    Hyperlipidemia     Family History  Problem Relation Age of Onset   Stroke Mother    Hypertension Mother    Cancer Sister    Hypertension Sister     SOCIAL HISTORY: Social History   Tobacco Use   Smoking status: Former    Packs/day: .25    Types: Cigarettes    Quit date: 05/27/2018    Years since quitting: 3.8   Smokeless tobacco: Never  Substance Use Topics   Alcohol use: Yes    Comment: occ. wine    Allergies  Allergen Reactions   Buspirone Hcl    Rosuvastatin Other (See Comments)    Myalgia   Sulfa Antibiotics Nausea And Vomiting    GI upset    Sulfamethoxazole Nausea And Vomiting    Current Outpatient Medications  Medication Sig Dispense Refill   ALPRAZolam (XANAX) 0.5 MG tablet Take 0.5 mg by mouth at bedtime as needed for anxiety.      cetirizine (ZYRTEC) 10 MG tablet Take 10 mg by mouth daily.       citalopram (CELEXA) 10  MG tablet Take 10 mg by mouth daily.     lisinopril (ZESTRIL) 5 MG tablet TK 1 T PO ONCE D     predniSONE (DELTASONE) 10 MG tablet Take 10 mg by mouth.     No current facility-administered medications for this visit.    REVIEW OF SYSTEMS:  [X]  denotes positive finding, [ ]  denotes negative finding Cardiac  Comments:  Chest pain or chest pressure:    Shortness of breath upon exertion:    Short of breath when lying flat:    Irregular heart rhythm:        Vascular    Pain in calf, thigh, or hip brought on by ambulation: x   Pain in feet at night that wakes you up from your sleep:     Blood clot in your veins:    Leg swelling:           PHYSICAL EXAM: Vitals:   04/17/22 0826 04/17/22 0829  BP: (!) 145/82 120/83  Pulse: 90   Temp: (!) 97.5 F (36.4 C)   SpO2: 96%   Weight: 135 lb 12.8 oz (61.6 kg)   Height: 5\' 4"  (1.626 m)     GENERAL: The patient is a well-nourished female, in no acute distress. The vital signs are documented  above. CARDIOVASCULAR: 2+ radial pulses bilaterally.  I do not palpate a right femoral pulse.  She also appears to have a diminished left femoral pulse.  She has an easily palpable 2+ dorsalis pedis pulse on the left.  No pedal pulse on the right. PULMONARY: There is good air exchange  MUSCULOSKELETAL: There are no major deformities or cyanosis. NEUROLOGIC: No focal weakness or paresthesias are detected. SKIN: There are no ulcers or rashes noted. PSYCHIATRIC: The patient has a normal affect.  DATA:  Noninvasive studies from 03/28/2022 reveal ankle arm index diminished to 0.52 on the right and normal at 0.95 on the left.  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  This is somewhat unusual at this Andrea Fleming postoperative stage of 3 years.  I have recommended CT angiogram for further evaluation.  I explained treatment options to include thrombectomy versus endovascular treatment versus femoral to femoral bypass.  We  will make further recommendations pending her CT scan.  She does report significant discomfort and is requesting gabapentin and Ultram which are improving her symptoms.  I will write for short course of these until she has clarification on treatment.    Larina Earthly, MD FACS [facility] [phone]  Note: Portions of this report may have been transcribed using voice recognition software.  Every effort has been made to ensure accuracy; however, inadvertent computerized transcription errors may still be present.

## 2022-04-19 ENCOUNTER — Other Ambulatory Visit: Payer: Self-pay

## 2022-04-19 DIAGNOSIS — I70219 Atherosclerosis of native arteries of extremities with intermittent claudication, unspecified extremity: Secondary | ICD-10-CM

## 2022-04-20 ENCOUNTER — Ambulatory Visit (HOSPITAL_BASED_OUTPATIENT_CLINIC_OR_DEPARTMENT_OTHER)
Admission: RE | Admit: 2022-04-20 | Discharge: 2022-04-20 | Disposition: A | Discharge status: Home / Self Care | Payer: 59 | Source: Ambulatory Visit | Attending: Vascular Surgery | Admitting: Vascular Surgery

## 2022-04-20 DIAGNOSIS — I70219 Atherosclerosis of native arteries of extremities with intermittent claudication, unspecified extremity: Secondary | ICD-10-CM

## 2022-04-20 MED ORDER — IOHEXOL 350 MG/ML SOLN
125.0000 mL | Freq: Once | INTRAVENOUS | Status: AC | PRN
  Administered 2022-04-20: 125 mL via INTRAVENOUS

## 2022-04-29 ENCOUNTER — Encounter: Payer: Self-pay | Admitting: Surgery

## 2022-04-29 ENCOUNTER — Ambulatory Visit: Payer: 59 | Admitting: Surgery

## 2022-04-29 VITALS — BP 100/67 | HR 98 | Temp 98.6°F | Resp 14 | Ht 64.0 in | Wt 134.0 lb

## 2022-04-29 DIAGNOSIS — I70211 Atherosclerosis of native arteries of extremities with intermittent claudication, right leg: Secondary | ICD-10-CM | POA: Diagnosis not present

## 2022-04-29 NOTE — Progress Notes (Signed)
[facility] [place]  Patient name: Andrea Fleming MRN: [id number] DOB: 1960/06/24 Sex: female   REASON FOR VISIT:    Follow up  HISOTRY OF PRESENT ILLNESS:    Andrea Fleming is a 62 y.o. female with a history of a urgent aortobifemoral bypass graft by Dr. Darrick Penna in May 2020 for gangrenous changes to her toes on the right, which ultimately healed.  She was last seen in our office in August 2021 with normal ankle-brachial indices.  Over the past month she has noticed recurrent claudication in her right hip thigh and calf.  This has become significant differently disabling for her she does not have any tissue loss.  She has a known history of a left subclavian artery occlusive disease which is asymptomatic.  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  She does have bluish discoloration of her left second toe as well as her right fifth toe.  She does not have any open wounds.  The patient has quit smoking since her aortobifemoral bypass graft.  She is medically managed for hypertension with an ACE inhibitor.  She has a statin allergy.   PAST MEDICAL HISTORY:   Past Medical History:  Diagnosis Date   Allergy    Condyloma 1993   Depression    Hyperlipidemia      FAMILY HISTORY:   Family History  Problem Relation Age of Onset   Stroke Mother    Hypertension Mother    Cancer Sister    Hypertension Sister     SOCIAL HISTORY:   Social History   Tobacco Use   Smoking status: Former    Packs/day: .25    Types: Cigarettes    Quit date: 05/27/2018    Years since quitting: 3.9   Smokeless tobacco: Never  Substance Use Topics   Alcohol use: Yes    Comment: occ. wine     ALLERGIES:   Allergies  Allergen Reactions   Buspirone Hcl    Rosuvastatin Other (See Comments)    Myalgia   Sulfa Antibiotics Nausea And Vomiting     GI upset    Sulfamethoxazole Nausea And Vomiting     CURRENT MEDICATIONS:   Current Outpatient Medications  Medication Sig Dispense Refill   ALPRAZolam (XANAX) 0.5 MG tablet Take 0.5 mg by mouth at bedtime as needed for anxiety.      cetirizine (ZYRTEC) 10 MG tablet Take 10 mg by mouth daily.       citalopram (CELEXA) 10 MG tablet Take 10 mg by mouth daily.     gabapentin (NEURONTIN) 300 MG capsule Take 1 capsule (300 mg total) by mouth daily. 30 capsule 0   lisinopril (ZESTRIL) 5 MG tablet TK 1 T PO ONCE D     predniSONE (DELTASONE) 10 MG tablet Take 10 mg by mouth.     traMADol (ULTRAM) 50 MG tablet Take 1 tablet (50 mg total) by mouth every 6 (six) hours as needed. 20 tablet 0   No current facility-administered medications for this visit.    REVIEW OF SYSTEMS:    denotes positive finding,  denotes negative finding Cardiac  Comments:  Chest pain or chest pressure:    Shortness of breath upon exertion:    Short of breath when lying flat:    Irregular heart rhythm:        Vascular    Pain  in calf, thigh, or hip brought on by ambulation: x   Pain in feet at night that wakes you up from your sleep:     Blood clot in your veins:    Leg swelling:         Pulmonary    Oxygen at home:    Productive cough:     Wheezing:         Neurologic    Sudden weakness in arms or legs:     Sudden numbness in arms or legs:     Sudden onset of difficulty speaking or slurred speech:    Temporary loss of vision in one eye:     Problems with dizziness:         Gastrointestinal    Blood in stool:     Vomited blood:         Genitourinary    Burning when urinating:     Blood in urine:        Psychiatric    Major depression:         Hematologic    Bleeding problems:    Problems with blood clotting too easily:        Skin    Rashes or ulcers:        Constitutional    Fever or chills:      PHYSICAL EXAM:   Vitals:   04/29/22 1036  BP: 100/67  Pulse: 98  Resp: 14   Temp: 98.6 F (37 C)  TempSrc: Temporal  SpO2: 95%  Weight: 134 lb (60.8 kg)  Height:  (1.626 m)    GENERAL: The patient is a well-nourished female, in no acute distress. The vital signs are documented above. CARDIAC: There is a regular rate and rhythm.  VASCULAR: Palpable left pedal pulse, nonpalpable right PULMONARY: Non-labored respirations ABDOMEN: Soft and non-tender except for the periumbilical area MUSCULOSKELETAL: There are no major deformities or cyanosis. NEUROLOGIC: No focal weakness or paresthesias are detected. SKIN: There are no ulcers or rashes noted. PSYCHIATRIC: The patient has a normal affect.  STUDIES:   I have reviewed her CT scan with the following findings: VASCULAR   1. 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 The aortic lumen narrows to 3-4 mm in this location. 2. Interval complete occlusion of the right aortofemoral bypass limb. 3. No significant femoropopliteal or runoff disease in either lower extremity.   NON-VASCULAR   1. Small fat containing midline ventral abdominal hernia. 2. Centrilobular pulmonary emphysema. 3. No acute abnormality within the abdomen or pelvis.   Aortic Atherosclerosis (ICD10-I70.0) and Emphysema (ICD10-J43.9).  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  She does have a mural thrombus up at her aortic anastomosis which could have embolized.  Alternatively, it could be an outflow issue.  She does have small vessels.  I discussed with her that I would plan on a right femoral cutdown with stenting of her aortic anastomosis and thrombectomy of her graft.  If I am unable to do this, she may require a left to right femoral-femoral bypass graft.  I am starting her back on an 81 mg aspirin.  She stopped  taking it because of bruising.  We will dose this every other day.  I also want her on a cholesterol-lowering agent.  She did not  tolerate Crestor due to myalgias.  She may be a candidate for an injectable.  We can address this while she is in the hospital.  Because her symptoms are so severe I am going to get her done this week, on Friday    [name], IV, MD, FACS [facility] [phone] Pager [phone]

## 2022-04-29 NOTE — H&P (View-Only) (Signed)
 [facility] [place]  Patient name: Andrea Fleming MRN: [id number] DOB: 11/16/1960 Sex: female   REASON FOR VISIT:    Follow up  HISOTRY OF PRESENT ILLNESS:    Andrea Fleming is a 62 y.o. female with a history of a urgent aortobifemoral bypass graft by Dr. Fields in May 2020 for gangrenous changes to her toes on the right, which ultimately healed.  She was last seen in our office in August 2021 with normal ankle-brachial indices.  Over the past month she has noticed recurrent claudication in her right hip thigh and calf.  This has become significant differently disabling for her she does not have any tissue loss.  She has a known history of a left subclavian artery occlusive disease which is asymptomatic.  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  She does have bluish discoloration of her left second toe as well as her right fifth toe.  She does not have any open wounds.  The patient has quit smoking since her aortobifemoral bypass graft.  She is medically managed for hypertension with an ACE inhibitor.  She has a statin allergy.   PAST MEDICAL HISTORY:   Past Medical History:  Diagnosis Date   Allergy    Condyloma 1993   Depression    Hyperlipidemia      FAMILY HISTORY:   Family History  Problem Relation Age of Onset   Stroke Mother    Hypertension Mother    Cancer Sister    Hypertension Sister     SOCIAL HISTORY:   Social History   Tobacco Use   Smoking status: Former    Packs/day: .25    Types: Cigarettes    Quit date: 05/27/2018    Years since quitting: 3.9   Smokeless tobacco: Never  Substance Use Topics   Alcohol use: Yes    Comment: occ. wine     ALLERGIES:   Allergies  Allergen Reactions   Buspirone Hcl    Rosuvastatin Other (See Comments)    Myalgia   Sulfa Antibiotics Nausea And Vomiting     GI upset    Sulfamethoxazole Nausea And Vomiting     CURRENT MEDICATIONS:   Current Outpatient Medications  Medication Sig Dispense Refill   ALPRAZolam (XANAX) 0.5 MG tablet Take 0.5 mg by mouth at bedtime as needed for anxiety.      cetirizine (ZYRTEC) 10 MG tablet Take 10 mg by mouth daily.       citalopram (CELEXA) 10 MG tablet Take 10 mg by mouth daily.     gabapentin (NEURONTIN) 300 MG capsule Take 1 capsule (300 mg total) by mouth daily. 30 capsule 0   lisinopril (ZESTRIL) 5 MG tablet TK 1 T PO ONCE D     predniSONE (DELTASONE) 10 MG tablet Take 10 mg by mouth.     traMADol (ULTRAM) 50 MG tablet Take 1 tablet (50 mg total) by mouth every 6 (six) hours as needed. 20 tablet 0   No current facility-administered medications for this visit.    REVIEW OF SYSTEMS:   [X] denotes positive finding, [ ] denotes negative finding Cardiac  Comments:  Chest pain or chest pressure:    Shortness of breath upon exertion:    Short of breath when lying flat:    Irregular heart rhythm:        Vascular    Pain   in calf, thigh, or hip brought on by ambulation: x   Pain in feet at night that wakes you up from your sleep:     Blood clot in your veins:    Leg swelling:         Pulmonary    Oxygen at home:    Productive cough:     Wheezing:         Neurologic    Sudden weakness in arms or legs:     Sudden numbness in arms or legs:     Sudden onset of difficulty speaking or slurred speech:    Temporary loss of vision in one eye:     Problems with dizziness:         Gastrointestinal    Blood in stool:     Vomited blood:         Genitourinary    Burning when urinating:     Blood in urine:        Psychiatric    Major depression:         Hematologic    Bleeding problems:    Problems with blood clotting too easily:        Skin    Rashes or ulcers:        Constitutional    Fever or chills:      PHYSICAL EXAM:   Vitals:   04/29/22 1036  BP: 100/67  Pulse: 98  Resp: 14   Temp: 98.6 F (37 C)  TempSrc: Temporal  SpO2: 95%  Weight: 134 lb (60.8 kg)  Height: 5' 4" (1.626 m)    GENERAL: The patient is a well-nourished female, in no acute distress. The vital signs are documented above. CARDIAC: There is a regular rate and rhythm.  VASCULAR: Palpable left pedal pulse, nonpalpable right PULMONARY: Non-labored respirations ABDOMEN: Soft and non-tender except for the periumbilical area MUSCULOSKELETAL: There are no major deformities or cyanosis. NEUROLOGIC: No focal weakness or paresthesias are detected. SKIN: There are no ulcers or rashes noted. PSYCHIATRIC: The patient has a normal affect.  STUDIES:   I have reviewed her CT scan with the following findings: VASCULAR   1. 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 The aortic lumen narrows to 3-4 mm in this location. 2. Interval complete occlusion of the right aortofemoral bypass limb. 3. No significant femoropopliteal or runoff disease in either lower extremity.   NON-VASCULAR   1. Small fat containing midline ventral abdominal hernia. 2. Centrilobular pulmonary emphysema. 3. No acute abnormality within the abdomen or pelvis.   Aortic Atherosclerosis (ICD10-I70.0) and Emphysema (ICD10-J43.9).  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  She does have a mural thrombus up at her aortic anastomosis which could have embolized.  Alternatively, it could be an outflow issue.  She does have small vessels.  I discussed with her that I would plan on a right femoral cutdown with stenting of her aortic anastomosis and thrombectomy of her graft.  If I am unable to do this, she may require a left to right femoral-femoral bypass graft.  I am starting her back on an 81 mg aspirin.  She stopped  taking it because of bruising.  We will dose this every other day.  I also want her on a cholesterol-lowering agent.  She did not   tolerate Crestor due to myalgias.  She may be a candidate for an injectable.  We can address this while she is in the hospital.  Because her symptoms are so severe I am going to get her done this week, on Friday    Wells Kyndall Chaplin, IV, MD, FACS [facility] [place] Tel [phone] Pager [phone]  

## 2022-04-30 ENCOUNTER — Other Ambulatory Visit: Payer: Self-pay

## 2022-04-30 DIAGNOSIS — I70211 Atherosclerosis of native arteries of extremities with intermittent claudication, right leg: Secondary | ICD-10-CM

## 2022-05-01 ENCOUNTER — Telehealth: Payer: Self-pay

## 2022-05-01 NOTE — Telephone Encounter (Signed)
Contacted patient to update on arrival time of 0530 AM for surgery on 4/26. She verbalized understanding.

## 2022-05-02 ENCOUNTER — Other Ambulatory Visit: Payer: Self-pay

## 2022-05-02 ENCOUNTER — Encounter (HOSPITAL_COMMUNITY): Payer: Self-pay | Admitting: Surgery

## 2022-05-02 NOTE — Progress Notes (Signed)
Ms Andrea Fleming denies chest pain of shortness of breath.  Patient denies having any s/s of Covid in her household, also denies any known exposure to Covid. Ms. Andrea Fleming  any s/s of upper or lower respiratory in the past 8 weeks.   Ms. Andrea Fleming PCP is Dr. Elfredia Nevins.

## 2022-05-03 ENCOUNTER — Inpatient Hospital Stay (HOSPITAL_COMMUNITY): Payer: 59 | Admitting: Certified Registered Nurse Anesthetist

## 2022-05-03 ENCOUNTER — Other Ambulatory Visit: Payer: Self-pay

## 2022-05-03 ENCOUNTER — Encounter (HOSPITAL_COMMUNITY): Payer: Self-pay | Admitting: Surgery

## 2022-05-03 ENCOUNTER — Inpatient Hospital Stay (HOSPITAL_COMMUNITY): Payer: 59

## 2022-05-03 ENCOUNTER — Inpatient Hospital Stay (HOSPITAL_COMMUNITY)
Admission: RE | Admit: 2022-05-03 | Discharge: 2022-05-04 | DRG: 271 | Disposition: A | Discharge status: Home / Self Care | Payer: 59 | Attending: Surgery | Admitting: Surgery

## 2022-05-03 ENCOUNTER — Encounter (HOSPITAL_COMMUNITY)
Admission: RE | Disposition: A | Discharge status: Home / Self Care | Payer: Self-pay | Source: Home / Self Care | Attending: Surgery

## 2022-05-03 DIAGNOSIS — J439 Emphysema, unspecified: Secondary | ICD-10-CM | POA: Diagnosis present

## 2022-05-03 DIAGNOSIS — Y828 Other medical devices associated with adverse incidents: Secondary | ICD-10-CM | POA: Diagnosis present

## 2022-05-03 DIAGNOSIS — I513 Intracardiac thrombosis, not elsewhere classified: Secondary | ICD-10-CM | POA: Diagnosis present

## 2022-05-03 DIAGNOSIS — I70211 Atherosclerosis of native arteries of extremities with intermittent claudication, right leg: Secondary | ICD-10-CM | POA: Diagnosis not present

## 2022-05-03 DIAGNOSIS — Z87891 Personal history of nicotine dependence: Secondary | ICD-10-CM | POA: Diagnosis not present

## 2022-05-03 DIAGNOSIS — F32A Depression, unspecified: Secondary | ICD-10-CM | POA: Diagnosis present

## 2022-05-03 DIAGNOSIS — Z7952 Long term (current) use of systemic steroids: Secondary | ICD-10-CM | POA: Diagnosis not present

## 2022-05-03 DIAGNOSIS — T82868A Thrombosis of vascular prosthetic devices, implants and grafts, initial encounter: Secondary | ICD-10-CM | POA: Diagnosis not present

## 2022-05-03 DIAGNOSIS — F419 Anxiety disorder, unspecified: Secondary | ICD-10-CM | POA: Diagnosis present

## 2022-05-03 DIAGNOSIS — E785 Hyperlipidemia, unspecified: Secondary | ICD-10-CM | POA: Diagnosis present

## 2022-05-03 DIAGNOSIS — Z823 Family history of stroke: Secondary | ICD-10-CM

## 2022-05-03 DIAGNOSIS — T82858A Stenosis of vascular prosthetic devices, implants and grafts, initial encounter: Secondary | ICD-10-CM

## 2022-05-03 DIAGNOSIS — I70221 Atherosclerosis of native arteries of extremities with rest pain, right leg: Secondary | ICD-10-CM | POA: Diagnosis present

## 2022-05-03 DIAGNOSIS — I700 Atherosclerosis of aorta: Secondary | ICD-10-CM | POA: Diagnosis present

## 2022-05-03 DIAGNOSIS — Z882 Allergy status to sulfonamides status: Secondary | ICD-10-CM | POA: Diagnosis not present

## 2022-05-03 DIAGNOSIS — I10 Essential (primary) hypertension: Secondary | ICD-10-CM | POA: Diagnosis present

## 2022-05-03 DIAGNOSIS — Z888 Allergy status to other drugs, medicaments and biological substances status: Secondary | ICD-10-CM

## 2022-05-03 DIAGNOSIS — Z8249 Family history of ischemic heart disease and other diseases of the circulatory system: Secondary | ICD-10-CM | POA: Diagnosis not present

## 2022-05-03 DIAGNOSIS — Z95820 Peripheral vascular angioplasty status with implants and grafts: Secondary | ICD-10-CM | POA: Diagnosis not present

## 2022-05-03 DIAGNOSIS — Z79899 Other long term (current) drug therapy: Secondary | ICD-10-CM

## 2022-05-03 DIAGNOSIS — I739 Peripheral vascular disease, unspecified: Secondary | ICD-10-CM

## 2022-05-03 HISTORY — DX: Anxiety disorder, unspecified: F41.9

## 2022-05-03 HISTORY — DX: Peripheral vascular disease, unspecified: I73.9

## 2022-05-03 HISTORY — DX: Other complications of anesthesia, initial encounter: T88.59XA

## 2022-05-03 HISTORY — DX: Anemia, unspecified: D64.9

## 2022-05-03 HISTORY — PX: PATCH ANGIOPLASTY: SHX6230

## 2022-05-03 HISTORY — PX: ABDOMINAL AORTIC ENDOVASCULAR STENT GRAFT: SHX5707

## 2022-05-03 HISTORY — DX: Essential (primary) hypertension: I10

## 2022-05-03 HISTORY — PX: THROMBECTOMY FEMORAL ARTERY: SHX6406

## 2022-05-03 HISTORY — DX: Headache, unspecified: R51.9

## 2022-05-03 LAB — CBC
HCT: 44.9 % (ref 36.0–46.0)
HCT: 37.4 % (ref 36.0–46.0)
HCT: 37.4 % (ref 36.0–46.0)
Hemoglobin: 15.2 g/dL — ABNORMAL HIGH (ref 12.0–15.0)
Hemoglobin: 12.3 g/dL (ref 12.0–15.0)
Hemoglobin: 12.4 g/dL (ref 12.0–15.0)
MCH: 34.2 pg — ABNORMAL HIGH (ref 26.0–34.0)
MCH: 33.6 pg (ref 26.0–34.0)
MCH: 33.6 pg (ref 26.0–34.0)
MCHC: 33.9 g/dL (ref 30.0–36.0)
MCHC: 32.9 g/dL (ref 30.0–36.0)
MCHC: 33.2 g/dL (ref 30.0–36.0)
MCV: 100.9 fL — ABNORMAL HIGH (ref 80.0–100.0)
MCV: 102.2 fL — ABNORMAL HIGH (ref 80.0–100.0)
MCV: 101.4 fL — ABNORMAL HIGH (ref 80.0–100.0)
Platelets: 291 10*3/uL (ref 150–400)
Platelets: 257 10*3/uL (ref 150–400)
Platelets: 262 10*3/uL (ref 150–400)
RBC: 4.45 MIL/uL (ref 3.87–5.11)
RBC: 3.66 MIL/uL — ABNORMAL LOW (ref 3.87–5.11)
RBC: 3.69 MIL/uL — ABNORMAL LOW (ref 3.87–5.11)
RDW: 14.3 % (ref 11.5–15.5)
RDW: 14.3 % (ref 11.5–15.5)
RDW: 13.9 % (ref 11.5–15.5)
WBC: 10.8 10*3/uL — ABNORMAL HIGH (ref 4.0–10.5)
WBC: 14 10*3/uL — ABNORMAL HIGH (ref 4.0–10.5)
WBC: 16.5 10*3/uL — ABNORMAL HIGH (ref 4.0–10.5)
nRBC: 0 % (ref 0.0–0.2)
nRBC: 0 % (ref 0.0–0.2)
nRBC: 0 % (ref 0.0–0.2)

## 2022-05-03 LAB — TYPE AND SCREEN: Unit division: 0

## 2022-05-03 LAB — COMPREHENSIVE METABOLIC PANEL
ALT: 16 U/L (ref 0–44)
AST: 18 U/L (ref 15–41)
Albumin: 3.8 g/dL (ref 3.5–5.0)
Alkaline Phosphatase: 83 U/L (ref 38–126)
Anion gap: 13 (ref 5–15)
BUN: 15 mg/dL (ref 6–20)
CO2: 25 mmol/L (ref 22–32)
Calcium: 9.6 mg/dL (ref 8.9–10.3)
Chloride: 102 mmol/L (ref 98–111)
Creatinine, Ser: 0.84 mg/dL (ref 0.44–1.00)
GFR, Estimated: >60 mL/min (ref >60)
Glucose, Bld: 108 mg/dL — ABNORMAL HIGH (ref 70–99)
Potassium: 4.9 mmol/L (ref 3.5–5.1)
Sodium: 140 mmol/L (ref 135–145)
Total Bilirubin: 0.7 mg/dL (ref 0.3–1.2)
Total Protein: 6.8 g/dL (ref 6.5–8.1)

## 2022-05-03 LAB — BASIC METABOLIC PANEL
Anion gap: 8 (ref 5–15)
BUN: 11 mg/dL (ref 6–20)
CO2: 21 mmol/L — ABNORMAL LOW (ref 22–32)
Calcium: 8.1 mg/dL — ABNORMAL LOW (ref 8.9–10.3)
Chloride: 105 mmol/L (ref 98–111)
Creatinine, Ser: 0.9 mg/dL (ref 0.44–1.00)
GFR, Estimated: >60 mL/min (ref >60)
Glucose, Bld: 127 mg/dL — ABNORMAL HIGH (ref 70–99)
Potassium: 4.5 mmol/L (ref 3.5–5.1)
Sodium: 134 mmol/L — ABNORMAL LOW (ref 135–145)

## 2022-05-03 LAB — APTT
aPTT: 29 seconds (ref 24–36)
aPTT: 33 seconds (ref 24–36)

## 2022-05-03 LAB — PROTIME-INR
INR: 1 (ref 0.8–1.2)
INR: 1.2 (ref 0.8–1.2)
Prothrombin Time: 13.4 seconds (ref 11.4–15.2)
Prothrombin Time: 14.8 seconds (ref 11.4–15.2)

## 2022-05-03 LAB — CREATININE, SERUM
Creatinine, Ser: 0.88 mg/dL (ref 0.44–1.00)
GFR, Estimated: >60 mL/min (ref >60)

## 2022-05-03 LAB — BPAM RBC
Blood Product Expiration Date: 202405162359
Unit Type and Rh: 6200
Unit Type and Rh: 6200

## 2022-05-03 LAB — GLUCOSE, CAPILLARY: Glucose-Capillary: 103 mg/dL — ABNORMAL HIGH (ref 70–99)

## 2022-05-03 LAB — POCT ACTIVATED CLOTTING TIME
Activated Clotting Time: 233 seconds
Activated Clotting Time: 244 seconds
Activated Clotting Time: 239 seconds

## 2022-05-03 LAB — PREPARE RBC (CROSSMATCH)

## 2022-05-03 LAB — MAGNESIUM: Magnesium: 1.7 mg/dL (ref 1.7–2.4)

## 2022-05-03 SURGERY — INSERTION, ENDOVASCULAR STENT GRAFT, AORTA, ABDOMINAL
Anesthesia: General | Site: Groin | Laterality: Right

## 2022-05-03 MED ORDER — PHENYLEPHRINE 80 MCG/ML (10ML) SYRINGE FOR IV PUSH (FOR BLOOD PRESSURE SUPPORT)
PREFILLED_SYRINGE | INTRAVENOUS | Status: DC | PRN
  Administered 2022-05-03: 80 ug via INTRAVENOUS
  Administered 2022-05-03: 160 ug via INTRAVENOUS
  Administered 2022-05-03: 80 ug via INTRAVENOUS

## 2022-05-03 MED ORDER — ROCURONIUM BROMIDE 10 MG/ML (PF) SYRINGE
PREFILLED_SYRINGE | INTRAVENOUS | Status: DC | PRN
  Administered 2022-05-03 (×2): 30 mg via INTRAVENOUS
  Administered 2022-05-03: 70 mg via INTRAVENOUS
  Administered 2022-05-03: 30 mg via INTRAVENOUS

## 2022-05-03 MED ORDER — DOCUSATE SODIUM 100 MG PO CAPS
100.0000 mg | ORAL_CAPSULE | Freq: Every day | ORAL | Status: DC
  Filled 2022-05-03: qty 1

## 2022-05-03 MED ORDER — ALUM & MAG HYDROXIDE-SIMETH 200-200-20 MG/5ML PO SUSP: 15.0000 mL | ORAL | Status: DC | PRN

## 2022-05-03 MED ORDER — OXYCODONE HCL 5 MG PO TABS
ORAL_TABLET | ORAL | Status: AC
  Filled 2022-05-03: qty 1

## 2022-05-03 MED ORDER — HYDROMORPHONE HCL 1 MG/ML IJ SOLN
INTRAMUSCULAR | Status: AC
  Filled 2022-05-03: qty 1

## 2022-05-03 MED ORDER — ASPIRIN 81 MG PO TBEC
81.0000 mg | DELAYED_RELEASE_TABLET | ORAL | Status: DC
  Administered 2022-05-03: 81 mg via ORAL
  Filled 2022-05-03: qty 1

## 2022-05-03 MED ORDER — PROPOFOL 10 MG/ML IV BOLUS
INTRAVENOUS | Status: DC | PRN
  Administered 2022-05-03: 20 mg via INTRAVENOUS
  Administered 2022-05-03: 50 mg via INTRAVENOUS
  Administered 2022-05-03: 70 mg via INTRAVENOUS
  Administered 2022-05-03: 30 mg via INTRAVENOUS
  Administered 2022-05-03: 40 mg via INTRAVENOUS

## 2022-05-03 MED ORDER — PROTAMINE SULFATE 10 MG/ML IV SOLN
INTRAVENOUS | Status: DC | PRN
  Administered 2022-05-03 (×2): 20 mg via INTRAVENOUS
  Administered 2022-05-03: 10 mg via INTRAVENOUS

## 2022-05-03 MED ORDER — FENTANYL CITRATE (PF) 100 MCG/2ML IJ SOLN
INTRAMUSCULAR | Status: AC
  Filled 2022-05-03: qty 2

## 2022-05-03 MED ORDER — CEFAZOLIN SODIUM-DEXTROSE 2-4 GM/100ML-% IV SOLN
INTRAVENOUS | Status: AC
  Filled 2022-05-03: qty 100

## 2022-05-03 MED ORDER — PHENYLEPHRINE HCL-NACL 20-0.9 MG/250ML-% IV SOLN
INTRAVENOUS | Status: DC | PRN
  Administered 2022-05-03: 25 ug/min via INTRAVENOUS

## 2022-05-03 MED ORDER — HYDRALAZINE HCL 20 MG/ML IJ SOLN: 5.0000 mg | INTRAMUSCULAR | Status: DC | PRN

## 2022-05-03 MED ORDER — PREDNISONE 20 MG PO TABS
20.0000 mg | ORAL_TABLET | Freq: Every day | ORAL | Status: DC
  Administered 2022-05-04: 20 mg via ORAL
  Filled 2022-05-03: qty 1

## 2022-05-03 MED ORDER — CHLORHEXIDINE GLUCONATE 0.12 % MT SOLN: 15.0000 mL | Freq: Once | OROMUCOSAL | Status: AC

## 2022-05-03 MED ORDER — OXYCODONE HCL 5 MG PO TABS
5.0000 mg | ORAL_TABLET | Freq: Once | ORAL | Status: AC
  Administered 2022-05-03: 5 mg via ORAL
  Filled 2022-05-03: qty 1

## 2022-05-03 MED ORDER — CEFAZOLIN SODIUM-DEXTROSE 2-4 GM/100ML-% IV SOLN
2.0000 g | INTRAVENOUS | Status: AC
  Administered 2022-05-03: 2 g via INTRAVENOUS
  Filled 2022-05-03: qty 100

## 2022-05-03 MED ORDER — PROPOFOL 10 MG/ML IV BOLUS
INTRAVENOUS | Status: AC
  Filled 2022-05-03: qty 20

## 2022-05-03 MED ORDER — SODIUM CHLORIDE 0.9 % IV SOLN: 500.0000 mL | Freq: Once | INTRAVENOUS | Status: DC | PRN

## 2022-05-03 MED ORDER — FENTANYL CITRATE (PF) 250 MCG/5ML IJ SOLN
INTRAMUSCULAR | Status: AC
  Filled 2022-05-03: qty 5

## 2022-05-03 MED ORDER — HEPARIN 6000 UNIT IRRIGATION SOLUTION
Status: AC
  Filled 2022-05-03: qty 1000

## 2022-05-03 MED ORDER — PANTOPRAZOLE SODIUM 40 MG PO TBEC
40.0000 mg | DELAYED_RELEASE_TABLET | Freq: Every day | ORAL | Status: DC
  Administered 2022-05-03 – 2022-05-04 (×2): 40 mg via ORAL
  Filled 2022-05-03 (×2): qty 1

## 2022-05-03 MED ORDER — ORAL CARE MOUTH RINSE: 15.0000 mL | Freq: Once | OROMUCOSAL | Status: AC

## 2022-05-03 MED ORDER — HYDROMORPHONE HCL 1 MG/ML IJ SOLN
0.5000 mg | INTRAMUSCULAR | Status: DC | PRN
  Administered 2022-05-03: 0.5 mg via INTRAVENOUS
  Administered 2022-05-03 – 2022-05-04 (×2): 1 mg via INTRAVENOUS
  Filled 2022-05-03 (×2): qty 1

## 2022-05-03 MED ORDER — POTASSIUM CHLORIDE CRYS ER 20 MEQ PO TBCR: 20.0000 meq | EXTENDED_RELEASE_TABLET | Freq: Every day | ORAL | Status: DC | PRN

## 2022-05-03 MED ORDER — BISACODYL 5 MG PO TBEC: 5.0000 mg | DELAYED_RELEASE_TABLET | Freq: Every day | ORAL | Status: DC | PRN

## 2022-05-03 MED ORDER — KETOTIFEN FUMARATE 0.035 % OP SOLN: 2.0000 [drp] | Freq: Every day | OPHTHALMIC | Status: DC | PRN

## 2022-05-03 MED ORDER — HEPARIN 6000 UNIT IRRIGATION SOLUTION
Status: DC | PRN
  Administered 2022-05-03: 1

## 2022-05-03 MED ORDER — ROCURONIUM BROMIDE 10 MG/ML (PF) SYRINGE
PREFILLED_SYRINGE | INTRAVENOUS | Status: AC
  Filled 2022-05-03: qty 10

## 2022-05-03 MED ORDER — ACETAMINOPHEN 10 MG/ML IV SOLN
1000.0000 mg | Freq: Once | INTRAVENOUS | Status: DC | PRN
  Administered 2022-05-03: 1000 mg via INTRAVENOUS

## 2022-05-03 MED ORDER — CHLORHEXIDINE GLUCONATE CLOTH 2 % EX PADS: 6.0000 | MEDICATED_PAD | Freq: Once | CUTANEOUS | Status: DC

## 2022-05-03 MED ORDER — AMISULPRIDE (ANTIEMETIC) 5 MG/2ML IV SOLN: 10.0000 mg | Freq: Once | INTRAVENOUS | Status: DC | PRN

## 2022-05-03 MED ORDER — SUGAMMADEX SODIUM 200 MG/2ML IV SOLN
INTRAVENOUS | Status: DC | PRN
  Administered 2022-05-03: 200 mg via INTRAVENOUS

## 2022-05-03 MED ORDER — FENTANYL CITRATE (PF) 100 MCG/2ML IJ SOLN
25.0000 ug | INTRAMUSCULAR | Status: DC | PRN
  Administered 2022-05-03: 50 ug via INTRAVENOUS

## 2022-05-03 MED ORDER — ALPRAZOLAM 0.5 MG PO TABS: 0.5000 mg | ORAL_TABLET | Freq: Every day | ORAL | Status: DC | PRN

## 2022-05-03 MED ORDER — LACTATED RINGERS IV SOLN: INTRAVENOUS | Status: DC | PRN

## 2022-05-03 MED ORDER — LIDOCAINE 2% (20 MG/ML) 5 ML SYRINGE
INTRAMUSCULAR | Status: AC
  Filled 2022-05-03: qty 5

## 2022-05-03 MED ORDER — GABAPENTIN 300 MG PO CAPS: 300.0000 mg | ORAL_CAPSULE | Freq: Every day | ORAL | Status: DC | PRN

## 2022-05-03 MED ORDER — MAGNESIUM SULFATE 2 GM/50ML IV SOLN: 2.0000 g | Freq: Every day | INTRAVENOUS | Status: DC | PRN

## 2022-05-03 MED ORDER — LACTATED RINGERS IV SOLN: INTRAVENOUS | Status: DC

## 2022-05-03 MED ORDER — TRAMADOL HCL 50 MG PO TABS
ORAL_TABLET | ORAL | Status: AC
  Filled 2022-05-03: qty 1

## 2022-05-03 MED ORDER — LIDOCAINE 2% (20 MG/ML) 5 ML SYRINGE
INTRAMUSCULAR | Status: DC | PRN
  Administered 2022-05-03: 50 mg via INTRAVENOUS

## 2022-05-03 MED ORDER — CEFAZOLIN SODIUM-DEXTROSE 2-4 GM/100ML-% IV SOLN
2.0000 g | Freq: Three times a day (TID) | INTRAVENOUS | Status: AC
  Administered 2022-05-03 – 2022-05-04 (×2): 2 g via INTRAVENOUS
  Filled 2022-05-03 (×2): qty 100

## 2022-05-03 MED ORDER — MIDAZOLAM HCL 2 MG/2ML IJ SOLN
INTRAMUSCULAR | Status: AC
  Filled 2022-05-03: qty 2

## 2022-05-03 MED ORDER — FENTANYL CITRATE (PF) 100 MCG/2ML IJ SOLN
25.0000 ug | INTRAMUSCULAR | Status: DC | PRN
  Administered 2022-05-03 (×2): 50 ug via INTRAVENOUS

## 2022-05-03 MED ORDER — PROMETHAZINE HCL 25 MG/ML IJ SOLN: 6.2500 mg | INTRAMUSCULAR | Status: DC | PRN

## 2022-05-03 MED ORDER — SENNA 8.6 MG PO TABS
1.0000 | ORAL_TABLET | ORAL | Status: DC
  Administered 2022-05-03: 8.6 mg via ORAL
  Filled 2022-05-03: qty 1

## 2022-05-03 MED ORDER — OXYCODONE-ACETAMINOPHEN 5-325 MG PO TABS
1.0000 | ORAL_TABLET | ORAL | Status: DC | PRN
  Administered 2022-05-03 – 2022-05-04 (×2): 2 via ORAL
  Filled 2022-05-03 (×2): qty 2

## 2022-05-03 MED ORDER — HEPARIN SODIUM (PORCINE) 1000 UNIT/ML IJ SOLN
INTRAMUSCULAR | Status: AC
  Filled 2022-05-03: qty 10

## 2022-05-03 MED ORDER — MEPERIDINE HCL 25 MG/ML IJ SOLN: 6.2500 mg | INTRAMUSCULAR | Status: DC | PRN

## 2022-05-03 MED ORDER — PROTAMINE SULFATE 10 MG/ML IV SOLN
INTRAVENOUS | Status: AC
  Filled 2022-05-03: qty 5

## 2022-05-03 MED ORDER — ONDANSETRON HCL 4 MG/2ML IJ SOLN
INTRAMUSCULAR | Status: DC | PRN
  Administered 2022-05-03: 4 mg via INTRAVENOUS

## 2022-05-03 MED ORDER — ESMOLOL HCL 100 MG/10ML IV SOLN
INTRAVENOUS | Status: AC
  Filled 2022-05-03: qty 10

## 2022-05-03 MED ORDER — TRAMADOL HCL 50 MG PO TABS
50.0000 mg | ORAL_TABLET | Freq: Four times a day (QID) | ORAL | Status: DC | PRN
  Administered 2022-05-03 – 2022-05-04 (×2): 50 mg via ORAL
  Filled 2022-05-03: qty 1

## 2022-05-03 MED ORDER — IODIXANOL 320 MG/ML IV SOLN
INTRAVENOUS | Status: DC | PRN
  Administered 2022-05-03: 66 mL via INTRA_ARTERIAL

## 2022-05-03 MED ORDER — HEPARIN SODIUM (PORCINE) 5000 UNIT/ML IJ SOLN
5000.0000 [IU] | Freq: Three times a day (TID) | INTRAMUSCULAR | Status: DC
  Administered 2022-05-03 – 2022-05-04 (×2): 5000 [IU] via SUBCUTANEOUS
  Filled 2022-05-03 (×2): qty 1

## 2022-05-03 MED ORDER — LABETALOL HCL 5 MG/ML IV SOLN: 10.0000 mg | INTRAVENOUS | Status: DC | PRN

## 2022-05-03 MED ORDER — ACETAMINOPHEN 325 MG PO TABS: 325.0000 mg | ORAL_TABLET | ORAL | Status: DC | PRN

## 2022-05-03 MED ORDER — HEPARIN SODIUM (PORCINE) 1000 UNIT/ML IJ SOLN
INTRAMUSCULAR | Status: DC | PRN
  Administered 2022-05-03: 6000 [IU] via INTRAVENOUS
  Administered 2022-05-03: 2000 [IU] via INTRAVENOUS

## 2022-05-03 MED ORDER — ACETAMINOPHEN 650 MG RE SUPP: 325.0000 mg | RECTAL | Status: DC | PRN

## 2022-05-03 MED ORDER — PHENYLEPHRINE HCL-NACL 20-0.9 MG/250ML-% IV SOLN: INTRAVENOUS | Status: DC | PRN

## 2022-05-03 MED ORDER — CHLORHEXIDINE GLUCONATE 0.12 % MT SOLN
OROMUCOSAL | Status: AC
  Administered 2022-05-03: 15 mL via OROMUCOSAL
  Filled 2022-05-03: qty 15

## 2022-05-03 MED ORDER — CITALOPRAM HYDROBROMIDE 20 MG PO TABS
10.0000 mg | ORAL_TABLET | Freq: Every day | ORAL | Status: DC
  Administered 2022-05-04: 10 mg via ORAL
  Filled 2022-05-03: qty 1

## 2022-05-03 MED ORDER — GUAIFENESIN-DM 100-10 MG/5ML PO SYRP: 15.0000 mL | ORAL_SOLUTION | ORAL | Status: DC | PRN

## 2022-05-03 MED ORDER — ACETAMINOPHEN 325 MG PO TABS: 325.0000 mg | ORAL_TABLET | Freq: Once | ORAL | Status: DC | PRN

## 2022-05-03 MED ORDER — CLOPIDOGREL BISULFATE 75 MG PO TABS
75.0000 mg | ORAL_TABLET | Freq: Every day | ORAL | Status: DC
  Administered 2022-05-04: 75 mg via ORAL
  Filled 2022-05-03: qty 1

## 2022-05-03 MED ORDER — SODIUM CHLORIDE 0.9 % IV SOLN: INTRAVENOUS | Status: DC

## 2022-05-03 MED ORDER — ACETAMINOPHEN 10 MG/ML IV SOLN
INTRAVENOUS | Status: AC
  Filled 2022-05-03: qty 100

## 2022-05-03 MED ORDER — ONDANSETRON HCL 4 MG/2ML IJ SOLN: 4.0000 mg | Freq: Four times a day (QID) | INTRAMUSCULAR | Status: DC | PRN

## 2022-05-03 MED ORDER — ACETAMINOPHEN 160 MG/5ML PO SOLN: 325.0000 mg | Freq: Once | ORAL | Status: DC | PRN

## 2022-05-03 MED ORDER — ONDANSETRON HCL 4 MG/2ML IJ SOLN
INTRAMUSCULAR | Status: AC
  Filled 2022-05-03: qty 2

## 2022-05-03 MED ORDER — ESMOLOL HCL 100 MG/10ML IV SOLN
INTRAVENOUS | Status: DC | PRN
  Administered 2022-05-03: 40 mg via INTRAVENOUS

## 2022-05-03 MED ORDER — SENNOSIDES-DOCUSATE SODIUM 8.6-50 MG PO TABS: 1.0000 | ORAL_TABLET | Freq: Every evening | ORAL | Status: DC | PRN

## 2022-05-03 MED ORDER — DEXAMETHASONE SODIUM PHOSPHATE 10 MG/ML IJ SOLN
INTRAMUSCULAR | Status: DC | PRN
  Administered 2022-05-03: 10 mg via INTRAVENOUS

## 2022-05-03 MED ORDER — 0.9 % SODIUM CHLORIDE (POUR BTL) OPTIME
TOPICAL | Status: DC | PRN
  Administered 2022-05-03: 1000 mL

## 2022-05-03 MED ORDER — FENTANYL CITRATE (PF) 250 MCG/5ML IJ SOLN
INTRAMUSCULAR | Status: DC | PRN
  Administered 2022-05-03 (×10): 50 ug via INTRAVENOUS

## 2022-05-03 MED ORDER — PHENOL 1.4 % MT LIQD: 1.0000 | OROMUCOSAL | Status: DC | PRN

## 2022-05-03 MED ORDER — DEXAMETHASONE SODIUM PHOSPHATE 10 MG/ML IJ SOLN
INTRAMUSCULAR | Status: AC
  Filled 2022-05-03: qty 1

## 2022-05-03 MED ORDER — LISINOPRIL 5 MG PO TABS
5.0000 mg | ORAL_TABLET | Freq: Every day | ORAL | Status: DC
  Administered 2022-05-03 – 2022-05-04 (×2): 5 mg via ORAL
  Filled 2022-05-03 (×2): qty 1

## 2022-05-03 MED ORDER — METOPROLOL TARTRATE 5 MG/5ML IV SOLN: 2.0000 mg | INTRAVENOUS | Status: DC | PRN

## 2022-05-03 MED ORDER — MIDAZOLAM HCL 2 MG/2ML IJ SOLN
INTRAMUSCULAR | Status: DC | PRN
  Administered 2022-05-03 (×2): 1 mg via INTRAVENOUS

## 2022-05-03 MED ORDER — HEMOSTATIC AGENTS (NO CHARGE) OPTIME
TOPICAL | Status: DC | PRN
  Administered 2022-05-03: 1 via TOPICAL

## 2022-05-03 SURGICAL SUPPLY — 74 items
ADH SKN CLS APL DERMABOND .7 (GAUZE/BANDAGES/DRESSINGS) ×8
AGENT HMST KT MTR STRL THRMB (HEMOSTASIS) ×4
BAG COUNTER SPONGE SURGICOUNT (BAG) ×5 IMPLANT
BAG SNAP BAND KOVER 36X36 (MISCELLANEOUS) ×1 IMPLANT
BAG SPNG CNTER NS LX DISP (BAG) ×4
BALLN MUSTANG 12X20X75 (BALLOONS) ×4
BALLN MUSTANG 8.0X40 75 (BALLOONS) ×4
BALLOON MUSTANG 12X20X75 (BALLOONS) ×1 IMPLANT
BALLOON MUSTANG 8.0X40 75 (BALLOONS) ×1 IMPLANT
BLADE CLIPPER SURG (BLADE) ×5 IMPLANT
CANISTER SUCT 3000ML PPV (MISCELLANEOUS) ×5 IMPLANT
CATH BEACON 5.038 65CM KMP-01 (CATHETERS) ×5 IMPLANT
CATH EMB 5FR 80CM (CATHETERS) ×1 IMPLANT
CATH EMB 6FR 80 (CATHETERS) ×1 IMPLANT
CATH OMNI FLUSH .035X70CM (CATHETERS) ×5 IMPLANT
CLIP TI MEDIUM 24 (CLIP) ×5 IMPLANT
CLIP TI WIDE RED SMALL 24 (CLIP) ×5 IMPLANT
COVER PROBE W GEL 5X96 (DRAPES) ×1 IMPLANT
DERMABOND ADVANCED .7 DNX12 (GAUZE/BANDAGES/DRESSINGS) ×6 IMPLANT
DEVICE CLOSURE PERCLS PRGLD 6F (VASCULAR PRODUCTS) ×17 IMPLANT
DEVICE TORQUE KENDALL .025-038 (MISCELLANEOUS) ×1 IMPLANT
DRAPE HALF SHEET 40X57 (DRAPES) ×1 IMPLANT
DRSG TEGADERM 2-3/8X2-3/4 SM (GAUZE/BANDAGES/DRESSINGS) ×10 IMPLANT
ELECT CAUTERY BLADE 6.4 (BLADE) ×5 IMPLANT
ELECT REM PT RETURN 9FT ADLT (ELECTROSURGICAL) ×8
ELECTRODE REM PT RTRN 9FT ADLT (ELECTROSURGICAL) ×10 IMPLANT
EXCLDR EXT ENDO 20X4.5 15F (Endovascular Graft) ×4 IMPLANT
EXCLUDER EXT ENDO 20X4.5 15F (Endovascular Graft) ×1 IMPLANT
GAUZE SPONGE 2X2 8PLY STRL LF (GAUZE/BANDAGES/DRESSINGS) ×9 IMPLANT
GLIDEWIRE ADV .035X180CM (WIRE) ×1 IMPLANT
GLOVE ECLIPSE 7.0 STRL STRAW (GLOVE) ×1 IMPLANT
GLOVE SURG SS PI 7.5 STRL IVOR (GLOVE) ×14 IMPLANT
GOWN STRL REUS W/ TWL LRG LVL3 (GOWN DISPOSABLE) ×10 IMPLANT
GOWN STRL REUS W/ TWL XL LVL3 (GOWN DISPOSABLE) ×10 IMPLANT
GOWN STRL REUS W/TWL LRG LVL3 (GOWN DISPOSABLE) ×8
GOWN STRL REUS W/TWL XL LVL3 (GOWN DISPOSABLE) ×8
GRAFT BALLN CATH 65CM (BALLOONS) ×5 IMPLANT
GUIDEWIRE ANGLED .035X150CM (WIRE) ×1 IMPLANT
HEMOSTAT SNOW SURGICEL 2X4 (HEMOSTASIS) IMPLANT
KIT BASIN OR (CUSTOM PROCEDURE TRAY) ×5 IMPLANT
KIT ENCORE 26 ADVANTAGE (KITS) ×1 IMPLANT
KIT TURNOVER KIT B (KITS) ×5 IMPLANT
LOOP VASCULAR MINI 18 RED (MISCELLANEOUS) ×4
NS IRRIG 1000ML POUR BTL (IV SOLUTION) ×10 IMPLANT
PACK PERIPHERAL VASCULAR (CUSTOM PROCEDURE TRAY) ×5 IMPLANT
PAD ARMBOARD 7.5X6 YLW CONV (MISCELLANEOUS) ×10 IMPLANT
PATCH HEMASHIELD 8X75 (Vascular Products) ×1 IMPLANT
PERCLOSE PROGLIDE 6F (VASCULAR PRODUCTS) ×4
SET MICROPUNCTURE 5F STIFF (MISCELLANEOUS) ×5 IMPLANT
SHEATH BRITE TIP 8FR 23CM (SHEATH) ×5 IMPLANT
SHEATH DRYSEAL FLEX 16FR 33CM (SHEATH) ×1 IMPLANT
SHEATH PINNACLE 6F 10CM (SHEATH) ×1 IMPLANT
SHEATH PINNACLE 8F 10CM (SHEATH) ×5 IMPLANT
SPONGE T-LAP 18X18 ~~LOC~~+RFID (SPONGE) ×1 IMPLANT
STOPCOCK MORSE 400PSI 3WAY (MISCELLANEOUS) ×5 IMPLANT
SURGIFLO W/THROMBIN 8M KIT (HEMOSTASIS) ×1 IMPLANT
SUT PROLENE 5 0 C 1 24 (SUTURE) ×9 IMPLANT
SUT PROLENE 6 0 BV (SUTURE) ×7 IMPLANT
SUT VIC AB 2-0 CT1 27 (SUTURE) ×4
SUT VIC AB 2-0 CT1 TAPERPNT 27 (SUTURE) ×9 IMPLANT
SUT VIC AB 3-0 SH 27 (SUTURE) ×4
SUT VIC AB 3-0 SH 27X BRD (SUTURE) ×9 IMPLANT
SUT VICRYL 4-0 PS2 18IN ABS (SUTURE) ×1 IMPLANT
SYR 20ML LL LF (SYRINGE) ×5 IMPLANT
SYR 3ML LL SCALE MARK (SYRINGE) ×1 IMPLANT
SYR MEDRAD MARK V 150ML (SYRINGE) ×1 IMPLANT
TAPE UMBILICAL 1/8X30 (MISCELLANEOUS) ×4 IMPLANT
TOWEL GREEN STERILE (TOWEL DISPOSABLE) ×5 IMPLANT
TRAY FOLEY MTR SLVR 16FR STAT (SET/KITS/TRAYS/PACK) ×5 IMPLANT
TUBING HIGH PRESSURE 120CM (CONNECTOR) ×5 IMPLANT
VASCULAR TIE MINI RED 18IN STL (MISCELLANEOUS) ×1 IMPLANT
WATER STERILE IRR 1000ML POUR (IV SOLUTION) ×5 IMPLANT
WIRE AMPLATZ SS-J .035X180CM (WIRE) ×9 IMPLANT
WIRE BENTSON .035X145CM (WIRE) ×10 IMPLANT

## 2022-05-03 NOTE — Op Note (Signed)
Patient name: Andrea Fleming MRN: [id number] DOB: 1960/06/27 Sex: female  05/03/2022 Pre-operative Diagnosis: Right leg claudication Post-operative diagnosis:  Same Surgeon:  [name] Assistants:  Clinton Gallant PA Procedure:   #1: Redo right femoral artery exposure   #2: Ultrasound-guided access, left femoral artery   #3: Thrombectomy of right limb of aortobifemoral bypass graft   #4: Aortic stent   #5: Abdominal aortogram   #6: Right femoral artery patch angioplasty Anesthesia:  General Blood Loss:  200 cc Specimens:  none  Findings: I was able to perform thrombectomy of the right limb of the graft.  There was a severe stenosis between the native aorta and the previous aortobifemoral graft.  I placed a 20 x 4.5 Gore cuff across this area and dilated it with a 12 mm balloon.  I protected the left limb of the aortobifemoral graft with a 8 mm balloon.  She had palpable pulses after the procedure  Devices used: Gore cuff 20 x 4.5  Indications: This is a 62 year old female who is status post aortobifemoral bypass graft by Dr. Darrick Penna for a right foot wound which has healed.  She has recently developed severe right leg claudication.  A CT scan showed occlusion of the right limb of aortobifemoral bypass graft as well as a high-grade stenosis with thrombus at her aortic anastomosis.  She comes in today for revascularization.  Procedure:  The patient was identified in the holding area and taken to [facility] OR ROOM 16  The patient was then placed supine on the table. general anesthesia was administered.  The patient was prepped and draped in the usual sterile fashion.  A time out was called and antibiotics were administered.  A PA was necessary to expedite the procedure and assist with technical details.  She help with exposure by providing suction and retraction.  She help with wire exchanges, device deployment, and wound closure.  The patient's previous longitudinal right groin incision was opened  with a 10 blade.  Using sharp and cautery dissection, I exposed the superficial femoral and profundofemoral arteries which are very small in caliber measuring about 4 mm each.  I then dissected out the common femoral artery and then identified the right limb of the aortic graft.  The anastomosis was at the level of the inguinal ligament.  I got circumferential control of the proximal common femoral artery and the graft.  Next, ultrasound was used to evaluate the left femoral artery.  Under ultrasound guidance, the left limb of the aortobifemoral graft was cannulated with a micropuncture needle.  A Obinna wire was then inserted followed by placement of micropuncture sheath.  Bentson wire was then inserted.  Next a 5 French sheath was placed.  The patient was then fully heparinized.  I inflated a 8 mm balloon in the left limb of the graft to protect it from embolization.  I then occluded the vessels in the right groin and open the hood of the right limb of the aortic graft with a #11 blade and extended this with Potts scissors.  There was subacute thrombus present.  I then used a #4 Fogarty and a #5 Fogarty catheter to perform thrombectomy of the right limb of the graft.  After multiple passes, had excellent inflow.  I then inserted a Glidewire advantage up in the descending thoracic aorta and placed a 16 French dry seal sheath.  A Omni Flush catheter was advanced up the left side and to the level of L1.  An abdominal  aortogram was performed that showed a high-grade stenosis within the aorta at the level of the native aorta to the aortic graft anastomosis.  I then inserted a Gore 20 x 4 point 5 aortic cuff and deployed this at the level of the renal arteries extending into the aortic graft.  A completion arteriogram was then performed which showed a residual high-grade stenosis within the stent corresponding to the level of the arterial to graft anastomosis.  I performed balloon angioplasty of this area with a 12 mm  balloon with significantly improved results.  I did not want to dilate this any further due to concerns of aortic rupture.  There did appear to be residual thrombus within the proximal right limb of the graft and so an additional thrombectomy was performed with a #5 Fogarty catheter.  I removed additional thrombus which was consistent with an arterial plug.  I now had excellent inflow.  On completion imaging there appeared to be significantly improved blood flow through the infrarenal aorta.  I was satisfied with these results.  I then reoccluded the right limb of the graft.  I opened the graft down onto the common femoral artery to make sure there was no residual plaque or additional thrombus.  There was excellent backbleeding from the profunda and superficial femoral artery.  I selected a dacryon patch and performed patch angioplasty.  This went from the common femoral artery up onto the hood of the graft.  This was done with 6-0 Prolene.  Prior to completion, the appropriate flushing maneuvers were performed and the anastomosis was completed.  There was now an excellent palpable pulse within both common femoral arteries.  There is brisk Doppler signals in the profunda and superficial femoral artery.  We checked the feet and she had triphasic signals.  I then closed the left groin cannulation site with a ProGlide.  50 mg of protamine was then administered.  The wound in the right groin was irrigated.  Hemostasis was achieved.  The femoral sheath was reapproximated with 2-0 Vicryl.  The subcutaneous tissue was then closed with additional layers of Vicryl followed by subcuticular closure and Dermabond.  The patient was successfully extubated and taken recovery room in stable condition.   Disposition: To PACU stable.   Juleen China, M.D., [facility] [place] Office: [phone] Pager:  [phone]

## 2022-05-03 NOTE — Transfer of Care (Signed)
Immediate Anesthesia Transfer of Care Note  Patient: Andrea Fleming  Procedure(s) Performed: AORTIC STENT (GORE), INFRARENAL CUFF (Abdomen) THROMBECTOMY OF RIGHT LIMB OF AORTOBIFEMORAL BYPASS GRAFT (Right) PATCH ANGIOPLASTY OF RIGHT AORTOBIFEMORAL BYPASS LIMB AND RIGHT COMMON FEMORAL ARTERY  USING HEMASHIELD PALTINUM FINESSE PATCH (Groin)  Patient Location: PACU  Anesthesia Type:General  Level of Consciousness: awake, alert , and oriented  Airway & Oxygen Therapy: Patient Spontanous Breathing  Post-op Assessment: Report given to RN, Post -op Vital signs reviewed and stable, and Patient moving all extremities X 4  Post vital signs: Reviewed and stable  Last Vitals:  Vitals Value Taken Time  BP 123/84 05/03/22 1155  Temp    Pulse 100 05/03/22 1158  Resp 5 05/03/22 1158  SpO2 94 % 05/03/22 1158  Vitals shown include unvalidated device data.  Last Pain:  Vitals:   05/03/22 0627  TempSrc:   PainSc: 0-No pain         Complications: No notable events documented.

## 2022-05-03 NOTE — Anesthesia Preprocedure Evaluation (Addendum)
Anesthesia Evaluation  Patient identified by MRN, date of birth, ID band Patient awake    Reviewed: Allergy & Precautions, NPO status , Patient's Chart, lab work & pertinent test results  Airway Mallampati: II  TM Distance: >3 FB Neck ROM: Full    Dental  (+) Teeth Intact, Dental Advisory Given   Pulmonary former smoker   breath sounds clear to auscultation       Cardiovascular hypertension, Pt. on medications + Peripheral Vascular Disease   Rhythm:Regular Rate:Normal     Neuro/Psych  Headaches PSYCHIATRIC DISORDERS Anxiety Depression       GI/Hepatic negative GI ROS, Neg liver ROS,,,  Endo/Other  negative endocrine ROS    Renal/GU negative Renal ROS     Musculoskeletal negative musculoskeletal ROS (+)    Abdominal   Peds  Hematology   Anesthesia Other Findings   Reproductive/Obstetrics                             Anesthesia Physical Anesthesia Plan  ASA: 3  Anesthesia Plan: General   Post-op Pain Management: Tylenol PO (pre-op)*   Induction: Intravenous  PONV Risk Score and Plan: 4 or greater and Ondansetron, Dexamethasone, Midazolam and Scopolamine patch - Pre-op  Airway Management Planned: Oral ETT  Additional Equipment: Arterial line and Ultrasound Guidance Line Placement  Intra-op Plan:   Post-operative Plan: Extubation in OR  Informed Consent: I have reviewed the patients History and Physical, chart, labs and discussed the procedure including the risks, benefits and alternatives for the proposed anesthesia with the patient or authorized representative who has indicated his/her understanding and acceptance.     Dental advisory given  Plan Discussed with: CRNA  Anesthesia Plan Comments:        Anesthesia Quick Evaluation

## 2022-05-03 NOTE — Anesthesia Procedure Notes (Signed)
Central Venous Catheter Insertion Performed by: Shelton Silvas, MD, anesthesiologist Start/End4/26/2024 7:15 AM, 05/03/2022 7:25 AM Patient location: Pre-op. Preanesthetic checklist: patient identified, IV checked, site marked, risks and benefits discussed, surgical consent, monitors and equipment checked, pre-op evaluation, timeout performed and anesthesia consent Position: Trendelenburg Lidocaine 1% used for infiltration and patient sedated Hand hygiene performed , maximum sterile barriers used  and Seldinger technique used Catheter size: 8 Fr Total catheter length 16. Central line was placed.Double lumen Procedure performed using ultrasound guided technique. Ultrasound Notes:anatomy identified, needle tip was noted to be adjacent to the nerve/plexus identified, no ultrasound evidence of intravascular and/or intraneural injection and image(s) printed for medical record Attempts: 1 Following insertion, dressing applied, line sutured and Biopatch. Post procedure assessment: blood return through all ports  Patient tolerated the procedure well with no immediate complications.

## 2022-05-03 NOTE — Interval H&P Note (Signed)
History and Physical Interval Note:  05/03/2022 7:29 AM  Andrea Fleming  has presented today for surgery, with the diagnosis of Atherosclerosis of native artery of right lower extremity with intermittent claudication.  The various methods of treatment have been discussed with the patient and family. After consideration of risks, benefits and other options for treatment, the patient has consented to  Procedure(s): AORTIC STENT (GORE) (N/A) THROMBECTOMY OF RIGHT LIMB OF AORTOBIFEMORAL BYPASS GRAFT (Right) POSSIBLE FEMORAL-FEMORAL ARTERY BYPASS GRAFT (Bilateral) as a surgical intervention.  The patient's history has been reviewed, patient examined, no change in status, stable for surgery.  I have reviewed the patient's chart and labs.  Questions were answered to the patient's satisfaction.     [name]

## 2022-05-03 NOTE — Anesthesia Procedure Notes (Signed)
Arterial Line Insertion Start/End4/26/2024 8:25 AM, 05/03/2022 8:30 AM Performed by: Shelton Silvas, MD, anesthesiologist  Patient location: Pre-op. Preanesthetic checklist: patient identified, IV checked, site marked, risks and benefits discussed, surgical consent, monitors and equipment checked, pre-op evaluation, timeout performed and anesthesia consent Lidocaine 1% used for infiltration Right, brachial was placed Catheter size: 20 G Hand hygiene performed  and maximum sterile barriers used   Attempts: 1 Procedure performed using ultrasound guided technique. Following insertion, dressing applied and Biopatch. Post procedure assessment: normal and unchanged  Patient tolerated the procedure well with no immediate complications. Additional procedure comments: No image obtained. Marland Kitchen

## 2022-05-03 NOTE — Anesthesia Postprocedure Evaluation (Signed)
Anesthesia Post Note  Patient: Sacred Roa Renfroe  Procedure(s) Performed: AORTIC STENT (GORE), INFRARENAL CUFF (Abdomen) THROMBECTOMY OF RIGHT LIMB OF AORTOBIFEMORAL BYPASS GRAFT (Right) PATCH ANGIOPLASTY OF RIGHT AORTOBIFEMORAL BYPASS LIMB AND RIGHT COMMON FEMORAL ARTERY  USING HEMASHIELD PALTINUM FINESSE PATCH (Groin)     Patient location during evaluation: PACU Anesthesia Type: General Level of consciousness: awake and alert Pain management: pain level controlled Vital Signs Assessment: post-procedure vital signs reviewed and stable Respiratory status: spontaneous breathing, nonlabored ventilation, respiratory function stable and patient connected to nasal cannula oxygen Cardiovascular status: blood pressure returned to baseline and stable Postop Assessment: no apparent nausea or vomiting Anesthetic complications: no  No notable events documented.  Last Vitals:  Vitals:   05/03/22 1600 05/03/22 1630  BP: 118/76 109/76  Pulse: 74 77  Resp: 15 18  Temp:    SpO2: 94% 97%    Last Pain:  Vitals:   05/03/22 1630  TempSrc:   PainSc: 4     LLE Motor Response: Purposeful movement (05/03/22 1630) LLE Sensation: Full sensation (05/03/22 1630) RLE Motor Response: Purposeful movement (05/03/22 1630) RLE Sensation: Full sensation (05/03/22 1630)      Shelton Silvas

## 2022-05-03 NOTE — Anesthesia Procedure Notes (Signed)
Procedure Name: Intubation Date/Time: 05/03/2022 8:16 AM  Performed by: Nils Pyle, CRNAPre-anesthesia Checklist: Patient identified, Emergency Drugs available, Suction available and Patient being monitored Patient Re-evaluated:Patient Re-evaluated prior to induction Oxygen Delivery Method: Circle System Utilized Preoxygenation: Pre-oxygenation with 100% oxygen Induction Type: IV induction Ventilation: Mask ventilation without difficulty Laryngoscope Size: Miller and 2 Grade View: Grade I Tube type: Oral Tube size: 7.0 mm Number of attempts: 1 Airway Equipment and Method: Stylet and Oral airway Placement Confirmation: ETT inserted through vocal cords under direct vision, positive ETCO2 and breath sounds checked- equal and bilateral Secured at: 22 cm Tube secured with: Tape Dental Injury: Teeth and Oropharynx as per pre-operative assessment

## 2022-05-04 ENCOUNTER — Encounter (HOSPITAL_COMMUNITY): Payer: Self-pay | Admitting: Surgery

## 2022-05-04 LAB — BASIC METABOLIC PANEL
Anion gap: 7 (ref 5–15)
BUN: 13 mg/dL (ref 6–20)
CO2: 24 mmol/L (ref 22–32)
Calcium: 8.4 mg/dL — ABNORMAL LOW (ref 8.9–10.3)
Chloride: 102 mmol/L (ref 98–111)
Creatinine, Ser: 0.79 mg/dL (ref 0.44–1.00)
GFR, Estimated: >60 mL/min (ref >60)
Glucose, Bld: 107 mg/dL — ABNORMAL HIGH (ref 70–99)
Potassium: 4.3 mmol/L (ref 3.5–5.1)
Sodium: 133 mmol/L — ABNORMAL LOW (ref 135–145)

## 2022-05-04 LAB — CBC
HCT: 35.4 % — ABNORMAL LOW (ref 36.0–46.0)
Hemoglobin: 11.7 g/dL — ABNORMAL LOW (ref 12.0–15.0)
MCH: 33.8 pg (ref 26.0–34.0)
MCHC: 33.1 g/dL (ref 30.0–36.0)
MCV: 102.3 fL — ABNORMAL HIGH (ref 80.0–100.0)
Platelets: 256 10*3/uL (ref 150–400)
RBC: 3.46 MIL/uL — ABNORMAL LOW (ref 3.87–5.11)
RDW: 14.2 % (ref 11.5–15.5)
WBC: 15.3 10*3/uL — ABNORMAL HIGH (ref 4.0–10.5)
nRBC: 0 % (ref 0.0–0.2)

## 2022-05-04 MED ORDER — CHLORHEXIDINE GLUCONATE CLOTH 2 % EX PADS
6.0000 | MEDICATED_PAD | Freq: Every day | CUTANEOUS | Status: DC
  Administered 2022-05-04: 6 via TOPICAL

## 2022-05-04 MED ORDER — CLOPIDOGREL BISULFATE 75 MG PO TABS: 75.0000 mg | ORAL_TABLET | Freq: Every day | ORAL | 2 refills | Status: AC

## 2022-05-04 NOTE — Discharge Summary (Signed)
EVAR Discharge Summary   Andrea Fleming Feb 27, 1960 62 y.o. female  MRN: [id number]  Admission Date: 05/03/2022  Discharge Date: 05/04/2022  Physician: Nada Libman, MD  Admission Diagnosis: Aortic occlusion [facility]) [I70.0]  Hospital Course:  The patient was admitted to the hospital and taken to the operating room on 05/03/2022 and underwent:  #1: Redo right femoral artery exposure  #2: Ultrasound-guided access, left femoral artery #3: Thrombectomy of right limb of aortobifemoral bypass graft #4: Aortic stent  #5: Abdominal aortogram #6: Right femoral artery patch angioplasty by Dr. Myra Gianotti.    The pt tolerated the procedure well and was transported to the PACU in good condition.   By POD#1, her right groin incision remained clean, dry and intact without swelling or hematoma.  Left common femoral access site also without swelling or hematoma. Her bilateral lower extremities well perfused with palpable femoral and DP pulses bilaterally. She remained hemodynamically stable.   The remainder of the hospital course consisted of increasing mobilization and increasing intake of solids without difficulty.  She remained stable for discharge home POD#1. She will continue Aspirin and Plavix. Outpatient follow up arranged in Lipid clinic for evaluation of possible Repatha or other due to her statin intolerance. PDMP reviewed and she already has active order for Tramadol that was recently filled so no post operative pain medication sent to her pharmacy. Prescription for Plavix also sent to her pharmacy. She will follow up in our office in 3-4 weeks for incision check and CTA abdomen and pelvis with Dr. Lenell Antu.  CBC    Component Value Date/Time   WBC 15.3 (H) 05/04/2022 0600   RBC 3.46 (L) 05/04/2022 0600   HGB 11.7 (L) 05/04/2022 0600   HCT 35.4 (L) 05/04/2022 0600   PLT 256 05/04/2022 0600   MCV 102.3 (H) 05/04/2022 0600   MCH 33.8 05/04/2022 0600   MCHC 33.1 05/04/2022 0600   RDW 14.2  05/04/2022 0600   LYMPHSABS 2.6 05/31/2018 0915   MONOABS 0.8 05/31/2018 0915   EOSABS 0.2 05/31/2018 0915   BASOSABS 0.1 05/31/2018 0915    BMET    Component Value Date/Time   NA 133 (L) 05/04/2022 0600   K 4.3 05/04/2022 0600   CL 102 05/04/2022 0600   CO2 24 05/04/2022 0600   GLUCOSE 107 (H) 05/04/2022 0600   BUN 13 05/04/2022 0600   CREATININE 0.79 05/04/2022 0600   CALCIUM 8.4 (L) 05/04/2022 0600   GFRNONAA >60 05/04/2022 0600   GFRAA 58 (L) 05/29/2018 0455    Discharge Instructions     ABDOMINAL PROCEDURE/ANEURYSM REPAIR/AORTO-BIFEMORAL BYPASS:  Call MD for increased abdominal pain; cramping diarrhea; nausea/vomiting   Complete by: As directed    AMB Referral to Advanced Lipid Disorders Clinic   Complete by: As directed    Internal Lipid Clinic Referral Scheduling  Internal lipid clinic referrals are providers within [facility], who wish to refer established patients for routine management (help in starting PCSK9 inhibitor therapy) or advanced therapies.  Internal MD referral criteria:              1. All patients with LDL>190 mg/dL  2. All patients with Triglycerides >500 mg/dL  3. Patients with suspected or confirmed heterozygous familial hyperlipidemia (HeFH) or homozygous familial hyperlipidemia (HoFH)  4. Patients with family history of suspicious for genetic dyslipidemia desiring genetic testing  5. Patients refractory to standard guideline based therapy  6. Patients with statin intolerance (failed 2 statins, one of which must be a high potency statin)  7. Patients who the provider desires to be seen by MD   Internal PharmD referral criteria:   1. Follow-up patients for medication management  2. Follow-up for compliance monitoring  3. Patients for drug education  4. Patients with statin intolerance  5. PCSK9 inhibitor education and prior authorization approvals  6. Patients with triglycerides <500 mg/dL  External Lipid Clinic Referral  External lipid clinic  referrals are for providers outside of [facility], considered new clinic patients - automatically routed to MD schedule   Call MD for:  redness, tenderness, or signs of infection (pain, swelling, bleeding, redness, odor or green/yellow discharge around incision site)   Complete by: As directed    Call MD for:  severe or increased pain, loss or decreased feeling  in affected limb(s)   Complete by: As directed    Call MD for:  temperature >100.5   Complete by: As directed    Discharge wound care:   Complete by: As directed    Keep incision dry for 24 hours. You can then shower/ wash incisions with mild soap and water, pat dry. Do not soak in bathtub. Do not apply any topical lotions, ointments, etc   Driving Restrictions   Complete by: As directed    No driving while taking narcotic pain medication   Increase activity slowly   Complete by: As directed    Walk with assistance use walker or cane as needed   Lifting restrictions   Complete by: As directed    No heavy lifting, pushing, pulling > 10 lbs for 4-6 weeks   Resume previous diet   Complete by: As directed        Discharge Diagnosis:  Aortic occlusion (HCC) [I70.0]  Secondary Diagnosis: Patient Active Problem List   Diagnosis Date Noted   Aortic occlusion (HCC) 05/03/2022   Aortoiliac occlusive disease (HCC) 05/27/2018   Skin cyst 11/24/2015   Soft tissue infection 11/24/2015   Cellulitis and abscess of head 11/17/2015   Depression 04/06/2010   Seasonal allergies 04/06/2010   Past Medical History:  Diagnosis Date   Allergy    Anemia    Anxiety    Complication of anesthesia    took days to wake up   Condyloma 1993   Depression    Headache    Hyperlipidemia    Hypertension    Peripheral vascular disease (HCC)      Allergies as of 05/04/2022       Reactions   Buspirone Hcl Other (See Comments)   Unknown   Rosuvastatin Other (See Comments)   Myalgia   Sulfa Antibiotics Nausea And Vomiting   GI upset    Sulfamethoxazole Nausea And Vomiting        Medication List     TAKE these medications    ALPRAZolam 0.5 MG tablet Commonly known as: XANAX Take 0.5 mg by mouth daily as needed for anxiety.   aspirin EC 81 MG tablet Take 81 mg by mouth every other day. Swallow whole.   cetirizine 10 MG tablet Commonly known as: ZYRTEC Take 10 mg by mouth daily.   citalopram 10 MG tablet Commonly known as: CELEXA Take 10 mg by mouth daily.   clopidogrel 75 MG tablet Commonly known as: PLAVIX Take 1 tablet (75 mg total) by mouth daily at 6 (six) AM. Start taking on: May 05, 2022   gabapentin 300 MG capsule Commonly known as: NEURONTIN Take 1 capsule (300 mg total) by mouth daily. What changed:  when to take  this reasons to take this   ketotifen 0.035 % ophthalmic solution Commonly known as: ZADITOR Place 2 drops into both eyes daily as needed (allergies).   lisinopril 5 MG tablet Commonly known as: ZESTRIL Take 5 mg by mouth daily.   predniSONE 10 MG tablet Commonly known as: DELTASONE Take 20 mg by mouth daily with breakfast. Taper start 05/02/22 10 mg daily for three days   senna 8.6 MG tablet Commonly known as: SENOKOT Take 1 tablet by mouth every 3 (three) days. Gummy   traMADol 50 MG tablet Commonly known as: ULTRAM Take 1 tablet (50 mg total) by mouth every 6 (six) hours as needed.               Discharge Care Instructions  (From admission, onward)           Start     Ordered   05/04/22 0000  Discharge wound care:       Comments: Keep incision dry for 24 hours. You can then shower/ wash incisions with mild soap and water, pat dry. Do not soak in bathtub. Do not apply any topical lotions, ointments, etc   05/04/22 0842            Discharge Instructions:   [facility]  Discharge Instructions Endovascular Aortic Aneurysm Repair  Please refer to the following instructions for your post-procedure care. Your  surgeon or Physician Assistant will discuss any changes with you.  Activity  You are encouraged to walk as much as you can. You can slowly return to normal activities but must avoid strenuous activity and heavy lifting until your doctor tells you it's OK. Avoid activities such as vacuuming or swinging a gold club. It is normal to feel tired for several weeks after your surgery. Do not drive until your doctor gives the OK and you are no longer taking prescription pain medications. It is also normal to have difficulty with sleep habits, eating, and bowel movements after surgery. These will go away with time.  Bathing/Showering  You may shower after you go home. If you have an incision, do not soak in a bathtub, hot tub, or swim until the incision heals completely.  Incision Care  Shower every day. Clean your incision with mild soap and water. Pat the area dry with a clean towel. You do not need a bandage unless otherwise instructed. Do not apply any ointments or creams to your incision. If you clothing is irritating, you may cover your incision with a dry gauze pad.  Diet  Resume your normal diet. There are no special food restrictions following this procedure. A low fat/low cholesterol diet is recommended for all patients with vascular disease. In order to heal from your surgery, it is CRITICAL to get adequate nutrition. Your body requires vitamins, minerals, and protein. Vegetables are the best source of vitamins and minerals. Vegetables also provide the perfect balance of protein. Processed food has little nutritional value, so try to avoid this.  Medications  Resume taking all of your medications unless your doctor or Physician Assistnat tells you not to. If your incision is causing pain, you may take over-the-counter pain relievers such as acetaminophen (Tylenol). If you were prescribed a stronger pain medication, please be aware these medications can cause nausea and constipation. Prevent  nausea by taking the medication with a snack or meal. Avoid constipation by drinking plenty of fluids and eating foods with a high amount of fiber, such as fruits, vegetables, and  grains. Do not take Tylenol if you are taking prescription pain medications.   Follow up  Our office will schedule a follow-up appointment with a C.T. scan 3-4 weeks after your surgery.  Please call us immediately for any of the following conditions  Severe or worsening pain in your legs or feet or in your abdomen back or chest. Increased pain, redness, drainage (pus) from your incision sit. Increased abdominal pain, bloating, nausea, vomiting or persistent diarrhea. Fever of 101 degrees or higher. Swelling in your leg (s),  Reduce your risk of vascular disease  Stop smoking. If you would like help call [facility] at 1-800-QUIT-NOW (507 773 0636) or [place] at [phone]. Manage your cholesterol Maintain a desired weight Control your diabetes Keep your blood pressure down  If you have questions, please call the office at [phone].    Prescriptions given: Plavix 75 mg #30 2 Refills  Disposition: Home  Patient's condition: is Good  Follow up: 1. Dr. Myra Gianotti in 4 weeks with CTA protocol   Graceann Congress, PA-C [facility] [phone] 05/04/2022  10:01 AM   - For VQI Registry use - Post-op:  Time to Extubation: [X]  In OR, [ ]  < 12 hrs, [ ]  12-24 hrs, [ ]  >=24 hrs Vasopressors Req. Post-op: No MI: No., [ ]  Troponin only, [ ]  EKG or Clinical New Arrhythmia: No CHF: No ICU Stay: 0 days Transfusion: No     If yes, 0 units given  Complications: Resp failure: No., [ ]  Pneumonia, [ ]  Ventilator Chg in renal function: No., [ ]  Inc. Cr > 0.5, [ ]  Temp. Dialysis,  [ ]  Permanent dialysis Leg ischemia: No., no Surgery needed, [ ]  Yes, Surgery needed,  [ ]  Amputation Bowel ischemia: No., [ ]  Medical Rx, [ ]  Surgical Rx Wound complication: No., [ ]  Superficial  separation/infection, [ ]  Return to OR Return to OR: No  Return to OR for bleeding: No Stroke: No., [ ]  Minor, [ ]  Major  Discharge medications: Statin use:  No  ASA use:  Yes  Plavix use:  Yes  Beta blocker use:  No  ARB use:  No ACEI use:  Yes CCB use:  No

## 2022-05-04 NOTE — Progress Notes (Addendum)
Patient CVC double lumen right internal jugular line removed per order,patient tolerated the procedure well,applied pressure for 15 minutes, patient educate to be in bedrest for 30 minutes.vitals sign taken

## 2022-05-04 NOTE — Discharge Instructions (Signed)
  [facility] [place]   Discharge Instructions  Endovascular Aortic Aneurysm Repair  Please refer to the following instructions for your post-procedure care. Your surgeon or Physician Assistant will discuss any changes with you.  Activity  You are encouraged to walk as much as you can. You can slowly return to normal activities but must avoid strenuous activity and heavy lifting until your doctor tells you it's OK. Avoid activities such as vacuuming or swinging a gold club. It is normal to feel tired for several weeks after your surgery. Do not drive until your doctor gives the OK and you are no longer taking prescription pain medications. It is also normal to have difficulty with sleep habits, eating, and bowel movements after surgery. These will go away with time.  Bathing/Showering  Shower daily after you go home.  Do not soak in a bathtub, hot tub, or swim until the incision heals completely.  If you have incisions in your groin, wash the groin wounds with soap and water daily and pat dry. (No tub bath-only shower)  Then put a dry gauze or washcloth there to keep this area dry to help prevent wound infection daily and as needed.  Do not use Vaseline or neosporin on your incisions.  Only use soap and water on your incisions and then protect and keep dry.  Incision Care  Shower every day. Clean your incision with mild soap and water. Pat the area dry with a clean towel. You do not need a bandage unless otherwise instructed. Do not apply any ointments or creams to your incision. If you clothing is irritating, you may cover your incision with a dry gauze pad.  Diet  Resume your normal diet. There are no special food restrictions following this procedure. A low fat/low cholesterol diet is recommended for all patients with vascular disease. In order to heal from your surgery, it is CRITICAL to get adequate nutrition. Your body requires vitamins, minerals, and protein.  Vegetables are the best source of vitamins and minerals. Vegetables also provide the perfect balance of protein. Processed food has little nutritional value, so try to avoid this.  Medications  Resume taking all of your medications unless your doctor or nurse practitioner tells you not to. If your incision is causing pain, you may take over-the-counter pain relievers such as acetaminophen (Tylenol). If you were prescribed a stronger pain medication, please be aware these medications can cause nausea and constipation. Prevent nausea by taking the medication with a snack or meal. Avoid constipation by drinking plenty of fluids and eating foods with a high amount of fiber, such as fruits, vegetables, and grains.  Do not take Tylenol if you are taking prescription pain medications.   Follow up  Our office will schedule a follow-up appointment with a CT scan 3-4 weeks after your surgery.  Please call us immediately for any of the following conditions  Severe or worsening pain in your legs or feet or in your abdomen back or chest. Increased pain, redness, drainage (pus) from your incision site. Increased abdominal pain, bloating, nausea, vomiting or persistent diarrhea. Fever of 101 degrees or higher. Swelling in your leg (s),  Reduce your risk of vascular disease  Stop smoking. If you would like help call [facility] at 1-800-QUIT-NOW (1-800-784-8669) or [place] at [phone]. Manage your cholesterol Maintain a desired weight Control your diabetes Keep your blood pressure down  If you have questions, please call the office at [phone].  

## 2022-05-04 NOTE — TOC Transition Note (Signed)
Transition of Care [facility]) - CM/SW Discharge Note   Patient Details  Name: Andrea Fleming MRN: [id number] Date of Birth: 01-06-1961  [facility]) CM/SW Contact:  Tom-Johnson, Hershal Coria, RN Phone Number: 05/04/2022, 9:23 AM   Clinical Narrative:     Patient is scheduled for discharge today.  Readmission Prevention Assessment done. Outpatient referral, hospital f/u and discharge instructions on AVS. No TOC needs or recommendations noted.   Family to transport at discharge.  No further TOC needs noted.          Final next level of care: Home/Self Care Barriers to Discharge: Barriers Resolved   Patient Goals and CMS Choice CMS Medicare.gov Compare Post Acute Care list provided to:: Patient Choice offered to / list presented to : NA  Discharge Placement                  Patient to be transferred to facility by: Family      Discharge Plan and Services Additional resources added to the After Visit Summary for                  DME Arranged: N/A DME Agency: NA       HH Arranged: NA HH Agency: NA        Social Determinants of Health (SDOH) Interventions SDOH Screenings   Food Insecurity: No Food Insecurity (05/04/2022)  Housing: Low Risk  (05/04/2022)  Transportation Needs: No Transportation Needs (05/04/2022)  Utilities: Not At Risk (05/04/2022)  Tobacco Use: Medium Risk (05/03/2022)     Readmission Risk Interventions    05/04/2022    9:22 AM  Readmission Risk Prevention Plan  Post Dischage Appt Complete  Medication Screening Complete  Transportation Screening Complete

## 2022-05-04 NOTE — Progress Notes (Addendum)
Progress Note    05/04/2022 8:34 AM 1 Day Post-Op  Subjective:  no complaints. Has ambulated in hallway. Ready to go home   Vitals:   05/04/22 0445 05/04/22 0735  BP: 93/66 118/65  Pulse: 70 74  Resp: 18   Temp: 97.9 F (36.6 C)   SpO2: 92% 92%   Physical Exam: Cardiac:  regular Lungs:  non labored Incisions:  right groin incision is c/d/I without swelling or hematoma. Left CF access site c/d/I without swelling or hematoma Extremities:  well perfused and warm. Palpable femoral pulses, palpable DP pulses bilaterally Abdomen:  soft, non tender Neurologic: alert and oriented  CBC    Component Value Date/Time   WBC 15.3 (H) 05/04/2022 0600   RBC 3.46 (L) 05/04/2022 0600   HGB 11.7 (L) 05/04/2022 0600   HCT 35.4 (L) 05/04/2022 0600   PLT 256 05/04/2022 0600   MCV 102.3 (H) 05/04/2022 0600   MCH 33.8 05/04/2022 0600   MCHC 33.1 05/04/2022 0600   RDW 14.2 05/04/2022 0600   LYMPHSABS 2.6 05/31/2018 0915   MONOABS 0.8 05/31/2018 0915   EOSABS 0.2 05/31/2018 0915   BASOSABS 0.1 05/31/2018 0915    BMET    Component Value Date/Time   NA 133 (L) 05/04/2022 0600   K 4.3 05/04/2022 0600   CL 102 05/04/2022 0600   CO2 24 05/04/2022 0600   GLUCOSE 107 (H) 05/04/2022 0600   BUN 13 05/04/2022 0600   CREATININE 0.79 05/04/2022 0600   CALCIUM 8.4 (L) 05/04/2022 0600   GFRNONAA >60 05/04/2022 0600   GFRAA 58 (L) 05/29/2018 0455    INR    Component Value Date/Time   INR 1.2 05/03/2022 1208     Intake/Output Summary (Last 24 hours) at 05/04/2022 0834 Last data filed at 05/04/2022 0530 Gross per 24 hour  Intake 2100 ml  Output 1260 ml  Net 840 ml     Assessment/Plan:  62 y.o. female is s/p  #1: Redo right femoral artery exposure                       #2: Ultrasound-guided access, left femoral artery                       #3: Thrombectomy of right limb of aortobifemoral bypass graft                       #4: Aortic stent                       #5: Abdominal  aortogram                       #6: Right femoral artery patch angioplasty 1 Day Post-Op   Doing well post op Right groin incision is intact and well appearing Left common femoral acces site without swelling or hematoma BLE well perfused and warm with palpable DP pulses Hemodynamically stable She is stable for discharge home today Continue Aspirin and Plavix Will arrange referral to lipid clinic. She has statin intolerance She will follow up with [place] in 3-4 weeks for incision check and CTA abdomen and pelvis  Graceann Congress, PA-C [facility] [phone] 05/04/2022 8:34 AM  VASCULAR STAFF ADDENDUM: I have independently interviewed and examined the patient. I agree with the above.  Looks great after hybrid revision of aortobifemoral bypass graft which had thrombosis right limb. Palpable  pulses in bilateral feet. Right groin incision clean, dry and intact. Has ambulated and wants to go home. Aspirin, Plavix.  We have spoken to the pharmacy team who will arrange outpatient follow-up with the lipid clinic for consideration of biologic lipid-lowering agent. Follow-up with [place] in 3 to 4 weeks as above.  Rande Brunt. Lenell Antu, MD [facility] Phone Number: [phone] 05/04/2022 8:57 AM

## 2022-05-05 LAB — BPAM RBC
Blood Product Expiration Date: 202405152359
Blood Product Expiration Date: 202405162359
Blood Product Expiration Date: 202405162359
Unit Type and Rh: 6200
Unit Type and Rh: 6200

## 2022-05-05 LAB — TYPE AND SCREEN
ABO/RH(D): A POS
Antibody Screen: NEGATIVE
Unit division: 0
Unit division: 0
Unit division: 0

## 2022-05-12 ENCOUNTER — Other Ambulatory Visit: Payer: Self-pay | Admitting: Vascular Surgery

## 2022-05-16 ENCOUNTER — Telehealth: Payer: Self-pay

## 2022-05-16 DIAGNOSIS — Z0279 Encounter for issue of other medical certificate: Secondary | ICD-10-CM

## 2022-05-16 NOTE — Telephone Encounter (Signed)
Pt called inquiring if she had to go to the lipid clinic that she was referred to or if her PCP could manage her cholesterol.  Reviewed pt's chart, returned call for clarification, two identifiers used. Pt stated that she is out of work and can't afford $100 copay to see a specialist. Informed her that her PCP should be able to manage her cholesterol, barring anything highly unusual. She does have an intolerance to statins. Pt has an appt in June to see PCP. Instructed pt to try OTC fish oil for the next month and have her PCP recheck her lipid levels. Pt also inquired about FMLA/disability paperwork. Msg sent to [place], CMA. Confirmed understanding.

## 2022-05-22 ENCOUNTER — Other Ambulatory Visit: Payer: Self-pay

## 2022-05-22 DIAGNOSIS — I7409 Other arterial embolism and thrombosis of abdominal aorta: Secondary | ICD-10-CM

## 2022-05-22 DIAGNOSIS — I70211 Atherosclerosis of native arteries of extremities with intermittent claudication, right leg: Secondary | ICD-10-CM

## 2022-05-27 ENCOUNTER — Encounter: Payer: Self-pay | Admitting: Internal Medicine

## 2022-06-15 ENCOUNTER — Ambulatory Visit (HOSPITAL_BASED_OUTPATIENT_CLINIC_OR_DEPARTMENT_OTHER)
Admission: RE | Admit: 2022-06-15 | Discharge: 2022-06-15 | Disposition: A | Discharge status: Home / Self Care | Payer: 59 | Source: Ambulatory Visit | Attending: Surgery | Admitting: Surgery

## 2022-06-15 DIAGNOSIS — I7409 Other arterial embolism and thrombosis of abdominal aorta: Secondary | ICD-10-CM | POA: Diagnosis present

## 2022-06-15 DIAGNOSIS — I70211 Atherosclerosis of native arteries of extremities with intermittent claudication, right leg: Secondary | ICD-10-CM

## 2022-06-15 MED ORDER — IOHEXOL 350 MG/ML SOLN
100.0000 mL | Freq: Once | INTRAVENOUS | Status: AC | PRN
  Administered 2022-06-15: 75 mL via INTRAVENOUS

## 2022-06-17 ENCOUNTER — Ambulatory Visit (INDEPENDENT_AMBULATORY_CARE_PROVIDER_SITE_OTHER): Payer: 59 | Admitting: Surgery

## 2022-06-17 ENCOUNTER — Encounter: Payer: Self-pay | Admitting: Surgery

## 2022-06-17 VITALS — BP 124/85 | HR 107 | Temp 97.9°F | Resp 20 | Ht 64.0 in | Wt 127.0 lb

## 2022-06-17 DIAGNOSIS — I70213 Atherosclerosis of native arteries of extremities with intermittent claudication, bilateral legs: Secondary | ICD-10-CM

## 2022-06-17 NOTE — Progress Notes (Signed)
Patient name: Andrea Fleming MRN: [id number] DOB: 12-Mar-1960 Sex: female  REASON FOR VISIT:     Post op  HISTORY OF PRESENT ILLNESS:   Andrea Fleming is a 62 y.o. female who is status post thrombectomy of the right limb of her aortobifemoral bypass graft, aortic stenting and right femoral dacryon patch angioplasty on 05/03/2022.  The patient had previously undergone an urgent aortobifemoral bypass graft by Dr. Darrick Penna in May 2020 for gangrenous changes to her right toes.  She had noticed claudication in her right calf and hip over the past month that have become very disabling.  She is recovering well.  She still has issues with constipation.  CURRENT MEDICATIONS:    Current Outpatient Medications  Medication Sig Dispense Refill   ALPRAZolam (XANAX) 0.5 MG tablet Take 0.5 mg by mouth daily as needed for anxiety.     aspirin EC 81 MG tablet Take 81 mg by mouth every other day. Swallow whole.     cetirizine (ZYRTEC) 10 MG tablet Take 10 mg by mouth daily.       citalopram (CELEXA) 10 MG tablet Take 10 mg by mouth daily.     clopidogrel (PLAVIX) 75 MG tablet Take 1 tablet (75 mg total) by mouth daily at 6 (six) AM. 30 tablet 2   lisinopril (ZESTRIL) 5 MG tablet Take 5 mg by mouth daily.     senna (SENOKOT) 8.6 MG tablet Take 1 tablet by mouth every 3 (three) days. Gummy     No current facility-administered medications for this visit.    REVIEW OF SYSTEMS:   [X]  denotes positive finding, [ ]  denotes negative finding Cardiac  Comments:  Chest pain or chest pressure:    Shortness of breath upon exertion:    Short of breath when lying flat:    Irregular heart rhythm:    Constitutional    Fever or chills:      PHYSICAL EXAM:   Vitals:   06/17/22 1113  BP: 124/85  Pulse: (!) 107  Resp: 20  Temp: 97.9 F (36.6 C)  SpO2: 95%  Weight: 127 lb (57.6 kg)  Height: 5\' 4"  (1.626 m)    GENERAL: The patient is a well-nourished female, in no acute  distress. The vital signs are documented above. CARDIOVASCULAR: There is a regular rate and rhythm. PULMONARY: Non-labored respirations Right groin incision has healed She has palpable right dorsalis pedis and left dorsalis pedis pulses  STUDIES:   CT scan: VASCULAR   1. Successful interval repair of stenosis at the proximal anastomosis of the aortobifemoral bypass graft. A stent graft has been placed resulting in resolution of the previously identified stenosis. No evidence of complicating feature. 2. Successful revascularization of the previously occluded right aortofemoral bypass   MEDICAL ISSUES:   Status post thrombectomy of right limb of her aortobifem graft.  This is widely patent today her incisions have healed.  She has palpable pedal pulses.  I would like for her to be evaluated for a cholesterol-lowering agent.  She is going to speak with her primary care physician about a different statin or possibly an injectable.  She is very worried about going back to work because of her job.  I think that she can remain out for duty until July 26 which would be 3 months.  She can do light duty.  She will follow-up with me in 3 months  Charlena Cross, MD, FACS [facility] [phone] Pager [phone])  [phone]     

## 2022-06-26 ENCOUNTER — Other Ambulatory Visit: Payer: Self-pay

## 2022-06-26 DIAGNOSIS — I7409 Other arterial embolism and thrombosis of abdominal aorta: Secondary | ICD-10-CM

## 2022-07-03 ENCOUNTER — Emergency Department (HOSPITAL_COMMUNITY): Payer: 59

## 2022-07-03 ENCOUNTER — Emergency Department (HOSPITAL_COMMUNITY)
Admission: EM | Admit: 2022-07-03 | Discharge: 2022-07-03 | Disposition: A | Discharge status: Home / Self Care | Payer: 59 | Attending: Emergency Medicine | Admitting: Emergency Medicine

## 2022-07-03 ENCOUNTER — Encounter (HOSPITAL_COMMUNITY): Payer: Self-pay | Admitting: Emergency Medicine

## 2022-07-03 ENCOUNTER — Other Ambulatory Visit: Payer: Self-pay

## 2022-07-03 ENCOUNTER — Telehealth: Payer: Self-pay

## 2022-07-03 DIAGNOSIS — W2203XA Walked into furniture, initial encounter: Secondary | ICD-10-CM | POA: Diagnosis not present

## 2022-07-03 DIAGNOSIS — I10 Essential (primary) hypertension: Secondary | ICD-10-CM | POA: Insufficient documentation

## 2022-07-03 DIAGNOSIS — S79911A Unspecified injury of right hip, initial encounter: Secondary | ICD-10-CM | POA: Diagnosis present

## 2022-07-03 DIAGNOSIS — Z7982 Long term (current) use of aspirin: Secondary | ICD-10-CM | POA: Diagnosis not present

## 2022-07-03 DIAGNOSIS — Z79899 Other long term (current) drug therapy: Secondary | ICD-10-CM | POA: Insufficient documentation

## 2022-07-03 DIAGNOSIS — S7001XA Contusion of right hip, initial encounter: Secondary | ICD-10-CM | POA: Diagnosis not present

## 2022-07-03 DIAGNOSIS — K5900 Constipation, unspecified: Secondary | ICD-10-CM | POA: Diagnosis not present

## 2022-07-03 DIAGNOSIS — Z7902 Long term (current) use of antithrombotics/antiplatelets: Secondary | ICD-10-CM | POA: Insufficient documentation

## 2022-07-03 DIAGNOSIS — M25551 Pain in right hip: Secondary | ICD-10-CM

## 2022-07-03 LAB — URINALYSIS, ROUTINE W REFLEX MICROSCOPIC
Glucose, UA: NEGATIVE mg/dL
Hgb urine dipstick: NEGATIVE
Ketones, ur: 5 mg/dL — AB
Nitrite: NEGATIVE
Protein, ur: 30 mg/dL — AB
Specific Gravity, Urine: 1.03 (ref 1.005–1.030)
pH: 5 (ref 5.0–8.0)

## 2022-07-03 LAB — BASIC METABOLIC PANEL
Anion gap: 13 (ref 5–15)
BUN: 14 mg/dL (ref 6–20)
CO2: 20 mmol/L — ABNORMAL LOW (ref 22–32)
Calcium: 9.2 mg/dL (ref 8.9–10.3)
Chloride: 103 mmol/L (ref 98–111)
Creatinine, Ser: 1.06 mg/dL — ABNORMAL HIGH (ref 0.44–1.00)
GFR, Estimated: >60 mL/min (ref >60)
Glucose, Bld: 140 mg/dL — ABNORMAL HIGH (ref 70–99)
Potassium: 3.9 mmol/L (ref 3.5–5.1)
Sodium: 136 mmol/L (ref 135–145)

## 2022-07-03 LAB — CBC
HCT: 36.7 % (ref 36.0–46.0)
Hemoglobin: 12 g/dL (ref 12.0–15.0)
MCH: 31.5 pg (ref 26.0–34.0)
MCHC: 32.7 g/dL (ref 30.0–36.0)
MCV: 96.3 fL (ref 80.0–100.0)
Platelets: 434 10*3/uL — ABNORMAL HIGH (ref 150–400)
RBC: 3.81 MIL/uL — ABNORMAL LOW (ref 3.87–5.11)
RDW: 13.2 % (ref 11.5–15.5)
WBC: 9.7 10*3/uL (ref 4.0–10.5)
nRBC: 0 % (ref 0.0–0.2)

## 2022-07-03 MED ORDER — OXYCODONE-ACETAMINOPHEN 5-325 MG PO TABS
1.0000 | ORAL_TABLET | Freq: Once | ORAL | Status: AC
  Administered 2022-07-03: 1 via ORAL
  Filled 2022-07-03: qty 1

## 2022-07-03 NOTE — ED Triage Notes (Signed)
Patient arrives ambulatory by POV c/o lower back pain and leg pain yesterday afternoon. Pain started after running into a hutch and hit her incision site from April where she had stents placed. Patient takes plavix and hasn't missed any doses.

## 2022-07-03 NOTE — Discharge Instructions (Addendum)
Tylenol, your prescribed gabapentin, tramadol follow-up with your right hip pain.  Warm compresses to be helpful for low back pain.   GETTING TO GOOD BOWEL HEALTH. Irregular bowel habits such as constipation and diarrhea can lead to many problems over time.  Having one soft bowel movement a day is the most important way to prevent further problems.  The anorectal canal is designed to handle stretching and feces to safely manage our ability to get rid of solid waste (feces, poop, stool) out of our body.  BUT, hard constipated stools can act like ripping concrete bricks and diarrhea can be a burning fire to this very sensitive area of our body, causing inflamed hemorrhoids, anal fissures, increasing risk is perirectal abscesses, abdominal pain/bloating, an making irritable bowel worse.     The goal: ONE SOFT BOWEL MOVEMENT A DAY!  To have soft, regular bowel movements:  Drink at least 8 tall glasses of water a day.   Take plenty of fiber.  Fiber is the undigested part of plant food that passes into the colon, acting s "natures broom" to encourage bowel motility and movement.  Fiber can absorb and hold large amounts of water. This results in a larger, bulkier stool, which is soft and easier to pass. Work gradually over several weeks up to 6 servings a day of fiber (25g a day even more if needed) in the form of: Vegetables -- Root (potatoes, carrots, turnips), leafy green (lettuce, salad greens, celery, spinach), or cooked high residue (cabbage, broccoli, etc) Fruit -- Fresh (unpeeled skin & pulp), Dried (prunes, apricots, cherries, etc ),  or stewed ( applesauce)  Whole grain breads, pasta, etc (whole wheat)  Bran cereals  Bulking Agents -- This type of water-retaining fiber generally is easily obtained each day by one of the following:  Psyllium bran -- The psyllium plant is remarkable because its ground seeds can retain so much water. This product is available as Metamucil, Konsyl, Effersyllium, Per Diem  Fiber, or the less expensive generic preparation in drug and health food stores. Although labeled a laxative, it really is not a laxative.  Methylcellulose -- This is another fiber derived from wood which also retains water. It is available as Citrucel. Polyethylene Glycol - and "artificial" fiber commonly called Miralax or Glycolax.  It is helpful for people with gassy or bloated feelings with regular fiber Flax Seed - a less gassy fiber than psyllium No reading or other relaxing activity while on the toilet. If bowel movements take longer than 5 minutes, you are too constipated AVOID CONSTIPATION.  High fiber and water intake usually takes care of this.  Sometimes a laxative is needed to stimulate more frequent bowel movements, but  Laxatives are not a good long-term solution as it can wear the colon out. Osmotics (Milk of Magnesia, Fleets phosphosoda, Magnesium citrate, MiraLax, GoLytely) are safer than  Stimulants (Senokot, Castor Oil, Dulcolax, Ex Lax)    Do not take laxatives for more than 7days in a row.  IF SEVERELY CONSTIPATED, try a Bowel Retraining Program: Do not use laxatives.  Eat a diet high in roughage, such as bran cereals and leafy vegetables.  Drink six (6) ounces of prune or apricot juice each morning.  Eat two (2) large servings of stewed fruit each day.  Take one (1) heaping tablespoon of a psyllium-based bulking agent twice a day. Use sugar-free sweetener when possible to avoid excessive calories.  Eat a normal breakfast.  Set aside 15 minutes after breakfast to sit on  the toilet, but do not strain to have a bowel movement.  If you do not have a bowel movement by the third day, use an enema and repeat the above steps.  Controlling diarrhea Switch to liquids and simpler foods for a few days to avoid stressing your intestines further. Avoid dairy products (especially milk & ice cream) for a short time.  The intestines often can lose the ability to digest lactose when  stressed. Avoid foods that cause gassiness or bloating.  Typical foods include beans and other legumes, cabbage, broccoli, and dairy foods.  Every person has some sensitivity to other foods, so listen to our body and avoid those foods that trigger problems for you. Adding fiber (Citrucel, Metamucil, psyllium, Miralax) gradually can help thicken stools by absorbing excess fluid and retrain the intestines to act more normally.  Slowly increase the dose over a few weeks.  Too much fiber too soon can backfire and cause cramping & bloating. Probiotics (such as active yogurt, Align, etc) may help repopulate the intestines and colon with normal bacteria and calm down a sensitive digestive tract.  Most studies show it to be of mild help, though, and such products can be costly. Medicines: Bismuth subsalicylate (ex. Kayopectate, Pepto Bismol) every 30 minutes for up to 6 doses can help control diarrhea.  Avoid if pregnant. Loperamide (Immodium) can slow down diarrhea.  Start with two tablets (4mg  total) first and then try one tablet every 6 hours.  Avoid if you are having fevers or severe pain.  If you are not better or start feeling worse, stop all medicines and call your doctor for advice Call your doctor if you are getting worse or not better.  Sometimes further testing (cultures, endoscopy, X-ray studies, bloodwork, etc) may be needed to help diagnose and treat the cause of the diarrhea.  Managing Pain  Pain after surgery or related to activity is often due to strain/injury to muscle, tendon, nerves and/or incisions.  This pain is usually short-term and will improve in a few months.   Many people find it helpful to do the following things TOGETHER to help speed the process of healing and to get back to regular activity more quickly:  Avoid heavy physical activity  no lifting greater than 20 pounds Do not "push through" the pain.  Listen to your body and avoid positions and maneuvers than reproduce the  pain Walking is okay as tolerated, but go slowly and stop when getting sore.  Remember: If it hurts to do it, then don't do it! Take Anti-inflammatory medication  Take with food/snack around the clock for 1-2 weeks This helps the muscle and nerve tissues become less irritable and calm down faster Choose ONE of the following over-the-counter medications: Naproxen 220mg  tabs (ex. Aleve) 1-2 pills twice a day  Ibuprofen 200mg  tabs (ex. Advil, Motrin) 3-4 pills with every meal and just before bedtime Acetaminophen 500mg  tabs (Tylenol) 1-2 pills with every meal and just before bedtime Use a Heating pad or Ice/Cold Pack 4-6 times a day May use warm bath/hottub  or showers Try Gentle Massage and/or Stretching  at the area of pain many times a day stop if you feel pain - do not overdo it  Try these steps together to help you body heal faster and avoid making things get worse.  Doing just one of these things may not be enough.    If you are not getting better after two weeks or are noticing you are getting worse, contact  our office for further advice; we may need to re-evaluate you & see what other things we can do to help.

## 2022-07-03 NOTE — ED Provider Notes (Signed)
[facility] EMERGENCY DEPARTMENT AT [facility] Provider Note   CSN: [id number] Arrival date & time: 07/03/22  1336     History  Chief Complaint  Patient presents with   Back Pain   Leg Pain    Andrea Fleming is a 62 y.o. female.   Back Pain Associated symptoms: leg pain   Leg Pain Associated symptoms: back pain    Patient is a 62 year old female with past medical history significant for anxiety, anemia, depression, HLD, HTN, PAD status post bypass graft and graft occulusion which required thrombectomy.  She is present emergency room today with complaints of right hip pain that began yesterday after she struck her right hip against a wooden chair.  Patient takes Plavix daily because of her PAD.  No other antiplatelet or anticoagulation.  She has been walking since her injury but states that she has had some issues with some low back pain that began today.  She states that her symptoms feel similar to 2000 and be constipation in the past.  She states that she generally passes hard stool.  She states she is not drinking much water in general.  She does not take any fiber she does take Colace daily as needed.   She denies any abdominal pain just painful breathing.  Denies any lower extremity pain apart from her right hip.  She states she developed a bruise yesterday after the injury.  No other injuries.     Home Medications Prior to Admission medications   Medication Sig Start Date End Date Taking? Authorizing Provider  ALPRAZolam Prudy Feeler) 0.5 MG tablet Take 0.5 mg by mouth daily as needed for anxiety.    [provider]  aspirin EC 81 MG tablet Take 81 mg by mouth every other day. Swallow whole.    [provider]  cetirizine (ZYRTEC) 10 MG tablet Take 10 mg by mouth daily.      [provider]  citalopram (CELEXA) 10 MG tablet Take 10 mg by mouth daily.    [provider]  clopidogrel (PLAVIX) 75 MG tablet Take 1 tablet (75 mg total)  by mouth daily at 6 (six) AM. 05/05/22   Baglia, Corrina, PA-C  lisinopril (ZESTRIL) 5 MG tablet Take 5 mg by mouth daily. 07/21/18   [provider]  senna (SENOKOT) 8.6 MG tablet Take 1 tablet by mouth every 3 (three) days. Gummy    [provider]      Allergies    Buspirone hcl, Rosuvastatin, Sulfa antibiotics, and Sulfamethoxazole    Review of Systems   Review of Systems  Musculoskeletal:  Positive for back pain.    Physical Exam Updated Vital Signs BP 138/82   Pulse 95   Temp 97.9 F (36.6 C) (Oral)   Resp (!) 21   Ht 5\' 4"  (1.626 m)   Wt 57.6 kg   LMP 08/17/2011   SpO2 100%   BMI 21.80 kg/m  Physical Exam Vitals and nursing note reviewed.  Constitutional:      General: She is not in acute distress. HENT:     Head: Normocephalic and atraumatic.     Nose: Nose normal.     Mouth/Throat:     Mouth: Mucous membranes are moist.  Eyes:     General: No scleral icterus. Cardiovascular:     Rate and Rhythm: Normal rate and regular rhythm.     Pulses: Normal pulses.     Heart sounds: Normal heart sounds.  Pulmonary:  Effort: Pulmonary effort is normal. No respiratory distress.     Breath sounds: No wheezing.  Abdominal:     Palpations: Abdomen is soft.     Tenderness: There is no abdominal tenderness. There is no guarding or rebound.  Musculoskeletal:     Cervical back: Normal range of motion.     Right lower leg: No edema.     Left lower leg: No edema.     Comments: There is a small amount of bruising to the right hip.  She is able to lift bilateral legs individually off of the exam bed.  Sensation tact bilateral lower extremities, DP PT pulses palpable in bilateral lower extremities  No midline C, T, L-spine tenderness  Skin:    General: Skin is warm and dry.     Capillary Refill: Capillary refill takes less than 2 seconds.     Comments: Right groin incision is clean, dry, no purulence  Neurological:     Mental Status: She is alert. Mental  status is at baseline.  Psychiatric:        Mood and Affect: Mood normal.        Behavior: Behavior normal.     ED Results / Procedures / Treatments   Labs (all labs ordered are listed, but only abnormal results are displayed) Labs Reviewed  CBC - Abnormal; Notable for the following components:      Result Value   RBC 3.81 (*)    Platelets 434 (*)    All other components within normal limits  BASIC METABOLIC PANEL - Abnormal; Notable for the following components:   CO2 20 (*)    Glucose, Bld 140 (*)    Creatinine, Ser 1.06 (*)    All other components within normal limits  URINALYSIS, ROUTINE W REFLEX MICROSCOPIC - Abnormal; Notable for the following components:   Color, Urine AMBER (*)    APPearance HAZY (*)    Bilirubin Urine SMALL (*)    Ketones, ur 5 (*)    Protein, ur 30 (*)    Leukocytes,Ua SMALL (*)    Bacteria, UA RARE (*)    All other components within normal limits    EKG None  Radiology DG Abdomen 1 View  Result Date: 07/03/2022 CLINICAL DATA:  Lower back and hip pain. EXAM: ABDOMEN - 1 VIEW COMPARISON:  May 27, 2018 FINDINGS: The bowel gas pattern is normal. Large amount of formed stool throughout the colon. No radio-opaque calculi or other significant radiographic abnormality are seen. Aortic stent graft in place. IMPRESSION: 1. Nonobstructive bowel gas pattern. 2. Large stool burden. Electronically Signed   By: Ted Mcalpine M.D.   On: 07/03/2022 14:44    Procedures Procedures    Medications Ordered in ED Medications  oxyCODONE-acetaminophen (PERCOCET/ROXICET) 5-325 MG per tablet 1 tablet (has no administration in time range)    ED Course/ Medical Decision Making/ A&P                             Medical Decision Making Risk Prescription drug management.   This patient presents to the ED for concern of R hip pain, this involves a number of treatment options, and is a complaint that carries with it a moderate risk of complications and  morbidity. A differential diagnosis was considered for the patient's symptoms which is discussed below:   Bursitis, fracture, contusion, dislocation   Co morbidities: Discussed in HPI   Brief History:  Patient is a  62 year old female with past medical history significant for anxiety, anemia, depression, HLD, HTN, PAD status post bypass graft and graft occulusion which required thrombectomy.  She is present emergency room today with complaints of right hip pain that began yesterday after she struck her right hip against a wooden chair.  Patient takes Plavix daily because of her PAD.  No other antiplatelet or anticoagulation.  She has been walking since her injury but states that she has had some issues with some low back pain that began today.  She states that her symptoms feel similar to 2000 and be constipation in the past.  She states that she generally passes hard stool.  She states she is not drinking much water in general.  She does not take any fiber she does take Colace daily as needed.   She denies any abdominal pain just painful breathing.  Denies any lower extremity pain apart from her right hip.  She states she developed a bruise yesterday after the injury.  No other injuries.    EMR reviewed including pt PMHx, past surgical history and past visits to ER.   See HPI for more details   Lab Tests:   I personally reviewed all laboratory work and imaging. Metabolic panel without any acute abnormality specifically kidney function within normal limits and no significant electrolyte abnormalities. CBC without leukocytosis or significant anemia.    Imaging Studies:  NAD. I personally reviewed all imaging studies and no acute abnormality found. I agree with radiology interpretation. Some heavy stool burden present   Cardiac Monitoring:  NA NA   Medicines ordered:  I ordered medication including Percocet for pain Reevaluation of the patient after these medicines  showed that the patient improved I have reviewed the patients home medicines and have made adjustments as needed   Critical Interventions:     Consults/Attending Physician    I briefly discussed this case with Dr. Myra Gianotti of vascular surgery who evaluated patient at bedside given that she called his office before coming to the emergency department.  He is not concerned from a vascular standpoint.   Reevaluation:     Social Determinants of Health:      Problem List / ED Course:  Right hip pain after colliding with a cabinet.  Has been walking and does not have any focal bony tenderness on exam.  She describes some back pain however it seems that this back pain is similar to when she has had constipation in the past and she feels constipated.  No trauma to her back.  Offered x-ray of right hip although she states that she is not concerned about fracture and is declining x-ray.  I do not think this is unreasonable.  I recommended light exercise, following up with primary care, Tylenol 1000 mg as needed for pain.   Dispostion:  After consideration of the diagnostic results and the patients response to treatment, I feel that the patent would benefit from discharge home   Final Clinical Impression(s) / ED Diagnoses Final diagnoses:  Constipation, unspecified constipation type  Right hip pain    Rx / DC Orders ED Discharge Orders     None         Gailen Shelter, [place] 07/03/22 1904    Benjiman Core, MD 07/04/22 [phone]

## 2022-07-03 NOTE — Telephone Encounter (Signed)
Pt called crying/upset that she is having pain in her low back/hips that started yesterday. She had been feeling okay until yesterday. She denies any other symptoms. She has been advised to report to ED. MD on call has been made aware of her coming this afternoon to ED.

## 2022-07-08 ENCOUNTER — Ambulatory Visit: Payer: 59 | Admitting: Surgery

## 2022-09-16 ENCOUNTER — Other Ambulatory Visit (HOSPITAL_COMMUNITY): Payer: Self-pay | Admitting: Surgery

## 2022-09-16 ENCOUNTER — Ambulatory Visit (INDEPENDENT_AMBULATORY_CARE_PROVIDER_SITE_OTHER)
Admission: RE | Admit: 2022-09-16 | Discharge: 2022-09-16 | Disposition: A | Discharge status: Home / Self Care | Payer: 59 | Source: Ambulatory Visit | Attending: Surgery | Admitting: Surgery

## 2022-09-16 ENCOUNTER — Encounter: Payer: Self-pay | Admitting: *Deleted

## 2022-09-16 ENCOUNTER — Ambulatory Visit (INDEPENDENT_AMBULATORY_CARE_PROVIDER_SITE_OTHER): Payer: 59 | Admitting: Surgery

## 2022-09-16 ENCOUNTER — Ambulatory Visit (HOSPITAL_COMMUNITY)
Admission: RE | Admit: 2022-09-16 | Discharge: 2022-09-16 | Disposition: A | Discharge status: Home / Self Care | Payer: 59 | Source: Ambulatory Visit | Attending: Surgery | Admitting: Surgery

## 2022-09-16 ENCOUNTER — Encounter: Payer: Self-pay | Admitting: Surgery

## 2022-09-16 VITALS — BP 150/88 | HR 87 | Temp 97.7°F | Resp 20 | Ht 64.0 in | Wt 132.0 lb

## 2022-09-16 DIAGNOSIS — I739 Peripheral vascular disease, unspecified: Secondary | ICD-10-CM

## 2022-09-16 DIAGNOSIS — I70213 Atherosclerosis of native arteries of extremities with intermittent claudication, bilateral legs: Secondary | ICD-10-CM

## 2022-09-16 DIAGNOSIS — I7409 Other arterial embolism and thrombosis of abdominal aorta: Secondary | ICD-10-CM | POA: Insufficient documentation

## 2022-09-16 LAB — VAS US ABI WITH/WO TBI
Left ABI: 1.13
Right ABI: 1.14

## 2022-09-16 NOTE — Progress Notes (Signed)
[facility] [place]  Patient name: Andrea Fleming MRN: [id number] DOB: [birth year] Sex: female   REASON FOR VISIT:    Follow up  HISOTRY OF PRESENT ILLNESS:    Andrea Fleming is a 62 y.o. female who is status post thrombectomy of the right limb of her aortobifemoral bypass graft, aortic stenting and right femoral dacryon patch angioplasty on 05/03/2022.  The patient had previously undergone an urgent aortobifemoral bypass graft by Dr. Darrick Penna in May 2020 for gangrenous changes to her right toes.  She had noticed claudication in her right calf and hip over the past month that have become very disabling.  She is now back at work having difficulty with bruising from her antiplatelet therapy.  She comes in with a LDL cholesterol of 106.  She is yet to see the lipid management team.  She is complaining of right hip pain   PAST MEDICAL HISTORY:   Past Medical History:  Diagnosis Date   Allergy    Anemia    Anxiety    Complication of anesthesia    took days to wake up   Condyloma 1993   Depression    Headache    Hyperlipidemia    Hypertension    Peripheral vascular disease (HCC)      FAMILY HISTORY:   Family History  Problem Relation Age of Onset   Stroke Mother    Hypertension Mother    Cancer Sister    Hypertension Sister     SOCIAL HISTORY:   Social History   Tobacco Use   Smoking status: Former    Current packs/day: 0.00    Types: Cigarettes    Quit date: 05/27/2018    Years since quitting: 4.3   Smokeless tobacco: Never  Substance Use Topics   Alcohol use: Yes    Comment: occ. wine     ALLERGIES:   Allergies  Allergen Reactions   Buspirone Hcl Other (See Comments)    Unknown   Rosuvastatin Other (See Comments)    Myalgia   Sulfa Antibiotics Nausea And Vomiting    GI upset    Sulfamethoxazole Nausea And Vomiting     CURRENT MEDICATIONS:   Current Outpatient Medications  Medication Sig  Dispense Refill   ALPRAZolam (XANAX) 0.5 MG tablet Take 0.5 mg by mouth daily as needed for anxiety.     aspirin EC 81 MG tablet Take 81 mg by mouth every other day. Swallow whole.     cetirizine (ZYRTEC) 10 MG tablet Take 10 mg by mouth daily.       citalopram (CELEXA) 10 MG tablet Take 10 mg by mouth daily.     clopidogrel (PLAVIX) 75 MG tablet Take 1 tablet (75 mg total) by mouth daily at 6 (six) AM. 30 tablet 2   ezetimibe (ZETIA) 10 MG tablet Take 10 mg by mouth daily.     lisinopril (ZESTRIL) 5 MG tablet Take 5 mg by mouth daily.     senna (SENOKOT) 8.6 MG tablet Take 1 tablet by mouth every 3 (three) days. Gummy     No current facility-administered medications for this visit.    REVIEW OF SYSTEMS:   [X]  denotes positive finding, [ ]  denotes negative finding Cardiac  Comments:  Chest pain or chest pressure:    Shortness of breath upon exertion:    Short of breath when lying flat:    Irregular heart rhythm:        Vascular    Pain in  calf, thigh, or hip brought on by ambulation:    Pain in feet at night that wakes you up from your sleep:     Blood clot in your veins:    Leg swelling:         Pulmonary    Oxygen at home:    Productive cough:     Wheezing:         Neurologic    Sudden weakness in arms or legs:     Sudden numbness in arms or legs:     Sudden onset of difficulty speaking or slurred speech:    Temporary loss of vision in one eye:     Problems with dizziness:         Gastrointestinal    Blood in stool:     Vomited blood:         Genitourinary    Burning when urinating:     Blood in urine:        Psychiatric    Major depression:         Hematologic    Bleeding problems:    Problems with blood clotting too easily:        Skin    Rashes or ulcers:        Constitutional    Fever or chills:      PHYSICAL EXAM:   Vitals:   09/16/22 0830  BP: (!) 150/88  Pulse: 87  Resp: 20  Temp: 97.7 F (36.5 C)  SpO2: 99%  Weight: 132 lb (59.9 kg)   Height: 5\' 4"  (1.626 m)    GENERAL: The patient is a well-nourished female, in no acute distress. The vital signs are documented above. CARDIAC: There is a regular rate and rhythm.  VASCULAR: Palpable pedal pulses PULMONARY: Non-labored respirations ABDOMEN: Soft and non-tender  MUSCULOSKELETAL: There are no major deformities or cyanosis. NEUROLOGIC: No focal weakness or paresthesias are detected. SKIN: There are no ulcers or rashes noted. PSYCHIATRIC: The patient has a normal affect.  STUDIES:   I have reviewed the following studies: Widely patent aortobifemoral bypass graft.  ABI/TBIToday's ABIToday's TBIPrevious ABIPrevious TBI  +-------+-----------+-----------+------------+------------+  Right 1.14       absent     1.07        0.31          +-------+-----------+-----------+------------+------------+  Left  1.13       0.38       1.10        0.61          +-------+-----------+-----------+------------+------------+      MEDICAL ISSUES:   PAD: Studies today are reassuring.  She has palpable pedal pulses.  She will remain on dual antiplatelet therapy.  She will return with follow-up studies in 6 months  Hypercholesterolemia: Her LDL is 106.  She has a statin allergy.  I am referring her to the lipid management center.  Activity: The patient is having difficulty at work.  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  I have encouraged her to minimize situations where she could end up with bleeding.   Charlena Cross, MD, FACS [facility] [phone] Pager [phone]

## 2022-09-24 ENCOUNTER — Other Ambulatory Visit: Payer: Self-pay

## 2022-09-24 DIAGNOSIS — I70219 Atherosclerosis of native arteries of extremities with intermittent claudication, unspecified extremity: Secondary | ICD-10-CM

## 2022-09-24 DIAGNOSIS — I70213 Atherosclerosis of native arteries of extremities with intermittent claudication, bilateral legs: Secondary | ICD-10-CM

## 2023-01-28 ENCOUNTER — Other Ambulatory Visit (HOSPITAL_COMMUNITY): Payer: Self-pay | Admitting: Family Medicine

## 2023-01-28 DIAGNOSIS — R921 Mammographic calcification found on diagnostic imaging of breast: Secondary | ICD-10-CM

## 2023-02-27 ENCOUNTER — Encounter (HOSPITAL_COMMUNITY): Payer: 59

## 2023-02-27 ENCOUNTER — Ambulatory Visit (HOSPITAL_COMMUNITY): Payer: 59

## 2023-02-27 ENCOUNTER — Encounter (HOSPITAL_COMMUNITY): Payer: Self-pay

## 2023-03-17 ENCOUNTER — Ambulatory Visit (INDEPENDENT_AMBULATORY_CARE_PROVIDER_SITE_OTHER): Payer: 59 | Admitting: Surgery

## 2023-03-17 ENCOUNTER — Encounter: Payer: Self-pay | Admitting: Surgery

## 2023-03-17 ENCOUNTER — Ambulatory Visit (HOSPITAL_COMMUNITY)
Admission: RE | Admit: 2023-03-17 | Discharge: 2023-03-17 | Disposition: A | Discharge status: Home / Self Care | Payer: 59 | Source: Ambulatory Visit | Attending: Surgery | Admitting: Surgery

## 2023-03-17 ENCOUNTER — Ambulatory Visit (INDEPENDENT_AMBULATORY_CARE_PROVIDER_SITE_OTHER)
Admission: RE | Admit: 2023-03-17 | Discharge: 2023-03-17 | Disposition: A | Discharge status: Home / Self Care | Payer: 59 | Source: Ambulatory Visit | Attending: Surgery | Admitting: Surgery

## 2023-03-17 VITALS — BP 131/84 | HR 79 | Temp 97.9°F | Ht 64.0 in | Wt 132.2 lb

## 2023-03-17 DIAGNOSIS — I70219 Atherosclerosis of native arteries of extremities with intermittent claudication, unspecified extremity: Secondary | ICD-10-CM | POA: Insufficient documentation

## 2023-03-17 DIAGNOSIS — I70213 Atherosclerosis of native arteries of extremities with intermittent claudication, bilateral legs: Secondary | ICD-10-CM | POA: Diagnosis present

## 2023-03-17 LAB — VAS US ABI WITH/WO TBI
Left ABI: 1.08
Right ABI: 1.16

## 2023-03-17 NOTE — Progress Notes (Signed)
 [facility] [place]  Patient name: Andrea Fleming MRN: [id number] DOB: January 02, 1961 Sex: female   REASON FOR VISIT:    Follow up  HISOTRY OF PRESENT ILLNESS:   Andrea Fleming is a 63 y.o. female who is status post thrombectomy of the right limb of her aortobifemoral bypass graft, aortic stenting and right femoral dacryon patch angioplasty on 05/03/2022.  The patient had previously undergone an urgent aortobifemoral bypass graft by Dr. Darrick Penna in May 2020 for gangrenous changes to her right toes.  She had noticed claudication in her right calf  which led to the above treatment.  At her last visit, she had palpable pedal pulses.  She did have a LDL of 106, and so she was referred to the lipid management center   She is now back at work having difficulty with bruising from her antiplatelet therapy.     PAST MEDICAL HISTORY:   Past Medical History:  Diagnosis Date   Allergy    Anemia    Anxiety    Complication of anesthesia    took days to wake up   Condyloma 1993   Depression    Headache    Hyperlipidemia    Hypertension    Peripheral vascular disease (HCC)      FAMILY HISTORY:   Family History  Problem Relation Age of Onset   Stroke Mother    Hypertension Mother    Cancer Sister    Hypertension Sister     SOCIAL HISTORY:   Social History   Tobacco Use   Smoking status: Former    Current packs/day: 0.00    Types: Cigarettes    Quit date: 05/27/2018    Years since quitting: 4.8   Smokeless tobacco: Never  Substance Use Topics   Alcohol use: Yes    Comment: occ. wine     ALLERGIES:   Allergies  Allergen Reactions   Buspirone Hcl Other (See Comments)    Unknown   Rosuvastatin Other (See Comments)    Myalgia   Sulfa Antibiotics Nausea And Vomiting    GI upset    Sulfamethoxazole Nausea And Vomiting     CURRENT MEDICATIONS:   Current Outpatient Medications  Medication Sig Dispense Refill    ALPRAZolam (XANAX) 0.5 MG tablet Take 0.5 mg by mouth daily as needed for anxiety.     cetirizine (ZYRTEC) 10 MG tablet Take 10 mg by mouth daily.       citalopram (CELEXA) 10 MG tablet Take 10 mg by mouth daily.     clopidogrel (PLAVIX) 75 MG tablet Take 1 tablet (75 mg total) by mouth daily at 6 (six) AM. 30 tablet 2   ezetimibe (ZETIA) 10 MG tablet Take 10 mg by mouth daily.     lisinopril (ZESTRIL) 5 MG tablet Take 5 mg by mouth daily.     senna (SENOKOT) 8.6 MG tablet Take 1 tablet by mouth every 3 (three) days. Gummy     No current facility-administered medications for this visit.    REVIEW OF SYSTEMS:   [X]  denotes positive finding, [ ]  denotes negative finding Cardiac  Comments:  Chest pain or chest pressure:    Shortness of breath upon exertion:    Short of breath when lying flat:    Irregular heart rhythm:        Vascular    Pain in calf, thigh, or hip brought on by ambulation:    Pain in feet at night that wakes you up from  your sleep:     Blood clot in your veins:    Leg swelling:         Pulmonary    Oxygen at home:    Productive cough:     Wheezing:         Neurologic    Sudden weakness in arms or legs:     Sudden numbness in arms or legs:     Sudden onset of difficulty speaking or slurred speech:    Temporary loss of vision in one eye:     Problems with dizziness:         Gastrointestinal    Blood in stool:     Vomited blood:         Genitourinary    Burning when urinating:     Blood in urine:        Psychiatric    Major depression:         Hematologic    Bleeding problems:    Problems with blood clotting too easily:        Skin    Rashes or ulcers:        Constitutional    Fever or chills:      PHYSICAL EXAM:   Vitals:   03/17/23 0915  BP: 131/84  Pulse: 79  Temp: 97.9 F (36.6 C)  TempSrc: Temporal  SpO2: 95%  Weight: 132 lb 3.2 oz (60 kg)  Height: 5\' 4"  (1.626 m)    GENERAL: The patient is a well-nourished female, in no acute  distress. The vital signs are documented above. CARDIAC: There is a regular rate and rhythm.  VASCULAR: Palpable pedal pulses PULMONARY: Non-labored respirations MUSCULOSKELETAL: There are no major deformities or cyanosis. NEUROLOGIC: No focal weakness or paresthesias are detected. SKIN: There are no ulcers or rashes noted. PSYCHIATRIC: The patient has a normal affect.  STUDIES:   I have reviewed the following: ABI/TBIToday's ABIToday's TBIPrevious ABIPrevious TBI  +-------+-----------+-----------+------------+------------+  Right 1.16       0.53       1.14        absent        +-------+-----------+-----------+------------+------------+  Left  1.08       0.50       1.13        0.38          +-------+-----------+-----------+------------+------------+  Widely patent aortobifemoral bypass graft.  Aortic stent appears patent.      MEDICAL ISSUES:   PAD: Patient has palpable pedal pulses and normal ABIs with no evidence of stenosis within her aortic stent or her aortobifemoral bypass graft.  She will follow-up in 1 year with repeat ABIs and aortoiliac duplex    [name], IV, MD, FACS [facility] [phone] Pager [phone]

## 2023-03-18 ENCOUNTER — Other Ambulatory Visit (HOSPITAL_COMMUNITY): Payer: 59

## 2023-03-18 ENCOUNTER — Encounter (HOSPITAL_COMMUNITY): Payer: 59

## 2023-03-20 ENCOUNTER — Ambulatory Visit (HOSPITAL_COMMUNITY)
Admission: RE | Admit: 2023-03-20 | Discharge: 2023-03-20 | Disposition: A | Discharge status: Home / Self Care | Payer: 59 | Source: Ambulatory Visit | Attending: Family Medicine | Admitting: Family Medicine

## 2023-03-20 ENCOUNTER — Encounter (HOSPITAL_COMMUNITY): Payer: Self-pay

## 2023-03-20 DIAGNOSIS — R921 Mammographic calcification found on diagnostic imaging of breast: Secondary | ICD-10-CM | POA: Insufficient documentation

## 2023-04-07 ENCOUNTER — Telehealth: Payer: Self-pay | Admitting: *Deleted

## 2023-04-07 NOTE — Telephone Encounter (Signed)
 Triage call regarding pain in the right groin that started over the weekends. Pt states the area stays sore but not painful like it has been. Pt states tylenol seems to decrease the pain. Pt states she hasn't done any strenuous activizes maybe her dog stood on her lap and hit it.No other symptoms beside pain in right groin.  Pt encouraged to take tylenol and call back if symptoms worsen .

## 2024-02-04 ENCOUNTER — Other Ambulatory Visit (HOSPITAL_COMMUNITY): Payer: Self-pay | Admitting: Internal Medicine

## 2024-02-04 DIAGNOSIS — Z1231 Encounter for screening mammogram for malignant neoplasm of breast: Secondary | ICD-10-CM

## 2024-03-25 ENCOUNTER — Ambulatory Visit (HOSPITAL_COMMUNITY)
